# Patient Record
Sex: Female | Born: 1935 | Race: White | Hispanic: No | Smoking: Never smoker
Health system: Southern US, Community
[De-identification: ages and names within clinical notes are randomized; demographics above are authoritative.]

## PROBLEM LIST (undated history)

## (undated) DIAGNOSIS — E785 Hyperlipidemia, unspecified: Secondary | ICD-10-CM

## (undated) DIAGNOSIS — I10 Essential (primary) hypertension: Secondary | ICD-10-CM

## (undated) DIAGNOSIS — I2510 Atherosclerotic heart disease of native coronary artery without angina pectoris: Secondary | ICD-10-CM

## (undated) DIAGNOSIS — I471 Supraventricular tachycardia: Principal | ICD-10-CM

## (undated) DIAGNOSIS — M1990 Unspecified osteoarthritis, unspecified site: Secondary | ICD-10-CM

## (undated) DIAGNOSIS — K611 Rectal abscess: Secondary | ICD-10-CM

## (undated) HISTORY — DX: Atherosclerotic heart disease of native coronary artery without angina pectoris: I25.10

## (undated) HISTORY — PX: CATARACT EXTRACTION: SUR2

## (undated) HISTORY — DX: Supraventricular tachycardia: I47.1

## (undated) HISTORY — DX: Essential (primary) hypertension: I10

## (undated) HISTORY — DX: Hyperlipidemia, unspecified: E78.5

## (undated) HISTORY — DX: Rectal abscess: K61.1

---

## 1976-09-22 HISTORY — PX: ABDOMINAL HYSTERECTOMY: SHX81

## 1999-04-16 ENCOUNTER — Other Ambulatory Visit: Admission: RE | Admit: 1999-04-16 | Discharge: 1999-04-16 | Payer: Self-pay | Admitting: Family Medicine

## 1999-07-25 ENCOUNTER — Encounter: Payer: Self-pay | Admitting: Surgery

## 1999-07-25 ENCOUNTER — Encounter (INDEPENDENT_AMBULATORY_CARE_PROVIDER_SITE_OTHER): Payer: Self-pay | Admitting: Specialist

## 1999-07-25 ENCOUNTER — Ambulatory Visit (HOSPITAL_COMMUNITY): Admission: RE | Admit: 1999-07-25 | Discharge: 1999-07-25 | Payer: Self-pay | Admitting: Surgery

## 1999-07-26 HISTORY — PX: BREAST BIOPSY: SHX20

## 1999-10-02 ENCOUNTER — Ambulatory Visit (HOSPITAL_COMMUNITY): Admission: RE | Admit: 1999-10-02 | Discharge: 1999-10-02 | Payer: Self-pay | Admitting: Gastroenterology

## 2000-09-02 ENCOUNTER — Ambulatory Visit (HOSPITAL_COMMUNITY): Admission: RE | Admit: 2000-09-02 | Discharge: 2000-09-02 | Payer: Self-pay | Admitting: Family Medicine

## 2000-09-02 ENCOUNTER — Encounter: Payer: Self-pay | Admitting: Family Medicine

## 2001-09-09 ENCOUNTER — Encounter: Payer: Self-pay | Admitting: Family Medicine

## 2001-09-09 ENCOUNTER — Ambulatory Visit (HOSPITAL_COMMUNITY): Admission: RE | Admit: 2001-09-09 | Discharge: 2001-09-09 | Payer: Self-pay | Admitting: Family Medicine

## 2001-10-26 ENCOUNTER — Encounter: Admission: RE | Admit: 2001-10-26 | Discharge: 2001-10-26 | Payer: Self-pay | Admitting: Family Medicine

## 2001-10-26 ENCOUNTER — Encounter: Payer: Self-pay | Admitting: Family Medicine

## 2002-08-03 ENCOUNTER — Other Ambulatory Visit: Admission: RE | Admit: 2002-08-03 | Discharge: 2002-08-03 | Payer: Self-pay | Admitting: Family Medicine

## 2002-08-31 ENCOUNTER — Encounter: Payer: Self-pay | Admitting: Family Medicine

## 2002-08-31 ENCOUNTER — Ambulatory Visit (HOSPITAL_COMMUNITY): Admission: RE | Admit: 2002-08-31 | Discharge: 2002-08-31 | Payer: Self-pay | Admitting: Family Medicine

## 2003-09-25 ENCOUNTER — Ambulatory Visit (HOSPITAL_COMMUNITY): Admission: RE | Admit: 2003-09-25 | Discharge: 2003-09-25 | Payer: Self-pay | Admitting: Family Medicine

## 2004-11-25 ENCOUNTER — Ambulatory Visit (HOSPITAL_COMMUNITY): Admission: RE | Admit: 2004-11-25 | Discharge: 2004-11-25 | Payer: Self-pay | Admitting: Family Medicine

## 2005-07-17 ENCOUNTER — Other Ambulatory Visit: Admission: RE | Admit: 2005-07-17 | Discharge: 2005-07-17 | Payer: Self-pay | Admitting: Family Medicine

## 2005-12-05 ENCOUNTER — Ambulatory Visit (HOSPITAL_COMMUNITY): Admission: RE | Admit: 2005-12-05 | Discharge: 2005-12-05 | Payer: Self-pay | Admitting: Family Medicine

## 2006-05-21 ENCOUNTER — Emergency Department (HOSPITAL_COMMUNITY): Admission: EM | Admit: 2006-05-21 | Discharge: 2006-05-21 | Payer: Self-pay | Admitting: Emergency Medicine

## 2006-12-15 ENCOUNTER — Ambulatory Visit (HOSPITAL_COMMUNITY): Admission: RE | Admit: 2006-12-15 | Discharge: 2006-12-15 | Payer: Self-pay | Admitting: Family Medicine

## 2007-12-23 ENCOUNTER — Ambulatory Visit (HOSPITAL_COMMUNITY): Admission: RE | Admit: 2007-12-23 | Discharge: 2007-12-23 | Payer: Self-pay | Admitting: Family Medicine

## 2008-01-03 ENCOUNTER — Encounter: Admission: RE | Admit: 2008-01-03 | Discharge: 2008-01-03 | Payer: Self-pay | Admitting: Family Medicine

## 2008-12-25 ENCOUNTER — Ambulatory Visit (HOSPITAL_COMMUNITY): Admission: RE | Admit: 2008-12-25 | Discharge: 2008-12-25 | Payer: Self-pay | Admitting: Family Medicine

## 2008-12-29 ENCOUNTER — Ambulatory Visit (HOSPITAL_COMMUNITY): Admission: RE | Admit: 2008-12-29 | Discharge: 2008-12-29 | Payer: Self-pay | Admitting: Family Medicine

## 2009-12-27 ENCOUNTER — Ambulatory Visit (HOSPITAL_COMMUNITY): Admission: RE | Admit: 2009-12-27 | Discharge: 2009-12-27 | Payer: Self-pay | Admitting: Family Medicine

## 2010-10-12 ENCOUNTER — Encounter: Payer: Self-pay | Admitting: Family Medicine

## 2010-10-13 ENCOUNTER — Encounter: Payer: Self-pay | Admitting: Family Medicine

## 2010-12-30 ENCOUNTER — Other Ambulatory Visit (HOSPITAL_COMMUNITY): Payer: Self-pay | Admitting: Family Medicine

## 2010-12-30 DIAGNOSIS — Z1231 Encounter for screening mammogram for malignant neoplasm of breast: Secondary | ICD-10-CM

## 2010-12-31 ENCOUNTER — Ambulatory Visit (HOSPITAL_COMMUNITY)
Admission: RE | Admit: 2010-12-31 | Discharge: 2010-12-31 | Disposition: A | Discharge status: Home / Self Care | Payer: Medicare Other | Source: Ambulatory Visit | Attending: Family Medicine | Admitting: Family Medicine

## 2010-12-31 DIAGNOSIS — Z1231 Encounter for screening mammogram for malignant neoplasm of breast: Secondary | ICD-10-CM | POA: Insufficient documentation

## 2011-03-03 ENCOUNTER — Other Ambulatory Visit (HOSPITAL_COMMUNITY): Payer: Self-pay | Admitting: Family Medicine

## 2011-03-03 DIAGNOSIS — Z780 Asymptomatic menopausal state: Secondary | ICD-10-CM

## 2011-03-11 ENCOUNTER — Ambulatory Visit (HOSPITAL_COMMUNITY)
Admission: RE | Admit: 2011-03-11 | Discharge: 2011-03-11 | Disposition: A | Discharge status: Home / Self Care | Payer: Medicare Other | Source: Ambulatory Visit | Attending: Family Medicine | Admitting: Family Medicine

## 2011-03-11 ENCOUNTER — Other Ambulatory Visit (HOSPITAL_COMMUNITY): Payer: Self-pay | Admitting: Family Medicine

## 2011-03-11 DIAGNOSIS — Z13820 Encounter for screening for osteoporosis: Secondary | ICD-10-CM | POA: Insufficient documentation

## 2011-03-11 DIAGNOSIS — Z780 Asymptomatic menopausal state: Secondary | ICD-10-CM

## 2011-05-20 ENCOUNTER — Ambulatory Visit: Payer: Medicare Other | Attending: Family Medicine

## 2011-05-20 DIAGNOSIS — R5381 Other malaise: Secondary | ICD-10-CM | POA: Insufficient documentation

## 2011-05-20 DIAGNOSIS — IMO0001 Reserved for inherently not codable concepts without codable children: Secondary | ICD-10-CM | POA: Insufficient documentation

## 2011-05-20 DIAGNOSIS — M25559 Pain in unspecified hip: Secondary | ICD-10-CM | POA: Insufficient documentation

## 2011-05-23 ENCOUNTER — Ambulatory Visit: Payer: Medicare Other | Admitting: Physical Therapy

## 2011-05-28 ENCOUNTER — Ambulatory Visit: Payer: Medicare Other | Attending: Family Medicine

## 2011-05-28 DIAGNOSIS — IMO0001 Reserved for inherently not codable concepts without codable children: Secondary | ICD-10-CM | POA: Insufficient documentation

## 2011-05-28 DIAGNOSIS — R5381 Other malaise: Secondary | ICD-10-CM | POA: Insufficient documentation

## 2011-05-28 DIAGNOSIS — M25559 Pain in unspecified hip: Secondary | ICD-10-CM | POA: Insufficient documentation

## 2011-05-29 ENCOUNTER — Ambulatory Visit: Payer: Medicare Other

## 2011-06-03 ENCOUNTER — Ambulatory Visit: Payer: Medicare Other | Admitting: Physical Therapy

## 2011-06-05 ENCOUNTER — Encounter: Payer: Medicare Other | Admitting: Physical Therapy

## 2011-09-18 ENCOUNTER — Other Ambulatory Visit: Payer: Self-pay | Admitting: Family Medicine

## 2011-09-18 ENCOUNTER — Ambulatory Visit
Admission: RE | Admit: 2011-09-18 | Discharge: 2011-09-18 | Disposition: A | Discharge status: Home / Self Care | Payer: Medicare Other | Source: Ambulatory Visit | Attending: Family Medicine | Admitting: Family Medicine

## 2011-09-18 DIAGNOSIS — J3489 Other specified disorders of nose and nasal sinuses: Secondary | ICD-10-CM

## 2011-09-18 DIAGNOSIS — R05 Cough: Secondary | ICD-10-CM

## 2011-09-23 DIAGNOSIS — K611 Rectal abscess: Secondary | ICD-10-CM

## 2011-09-23 HISTORY — DX: Rectal abscess: K61.1

## 2011-11-25 DIAGNOSIS — N390 Urinary tract infection, site not specified: Secondary | ICD-10-CM | POA: Diagnosis not present

## 2011-12-22 ENCOUNTER — Other Ambulatory Visit (HOSPITAL_COMMUNITY): Payer: Self-pay | Admitting: Family Medicine

## 2011-12-22 DIAGNOSIS — Z1231 Encounter for screening mammogram for malignant neoplasm of breast: Secondary | ICD-10-CM

## 2012-01-12 DIAGNOSIS — H179 Unspecified corneal scar and opacity: Secondary | ICD-10-CM | POA: Diagnosis not present

## 2012-01-12 DIAGNOSIS — H2510 Age-related nuclear cataract, unspecified eye: Secondary | ICD-10-CM | POA: Diagnosis not present

## 2012-01-12 DIAGNOSIS — IMO0002 Reserved for concepts with insufficient information to code with codable children: Secondary | ICD-10-CM | POA: Insufficient documentation

## 2012-01-14 ENCOUNTER — Ambulatory Visit (HOSPITAL_COMMUNITY)
Admission: RE | Admit: 2012-01-14 | Discharge: 2012-01-14 | Disposition: A | Discharge status: Home / Self Care | Payer: Medicare Other | Source: Ambulatory Visit | Attending: Family Medicine | Admitting: Family Medicine

## 2012-01-14 DIAGNOSIS — Z1231 Encounter for screening mammogram for malignant neoplasm of breast: Secondary | ICD-10-CM | POA: Insufficient documentation

## 2012-01-20 DIAGNOSIS — H903 Sensorineural hearing loss, bilateral: Secondary | ICD-10-CM | POA: Diagnosis not present

## 2012-03-15 DIAGNOSIS — H60399 Other infective otitis externa, unspecified ear: Secondary | ICD-10-CM | POA: Diagnosis not present

## 2012-05-03 DIAGNOSIS — H902 Conductive hearing loss, unspecified: Secondary | ICD-10-CM | POA: Diagnosis not present

## 2012-05-03 DIAGNOSIS — H60399 Other infective otitis externa, unspecified ear: Secondary | ICD-10-CM | POA: Diagnosis not present

## 2012-05-03 DIAGNOSIS — H6120 Impacted cerumen, unspecified ear: Secondary | ICD-10-CM | POA: Diagnosis not present

## 2012-07-12 DIAGNOSIS — Z23 Encounter for immunization: Secondary | ICD-10-CM | POA: Diagnosis not present

## 2012-07-21 DIAGNOSIS — K6289 Other specified diseases of anus and rectum: Secondary | ICD-10-CM | POA: Diagnosis not present

## 2012-07-26 ENCOUNTER — Encounter (INDEPENDENT_AMBULATORY_CARE_PROVIDER_SITE_OTHER): Payer: Self-pay | Admitting: General Surgery

## 2012-07-26 ENCOUNTER — Ambulatory Visit (INDEPENDENT_AMBULATORY_CARE_PROVIDER_SITE_OTHER): Payer: Medicare Other | Admitting: General Surgery

## 2012-07-26 ENCOUNTER — Other Ambulatory Visit (INDEPENDENT_AMBULATORY_CARE_PROVIDER_SITE_OTHER): Payer: Self-pay | Admitting: General Surgery

## 2012-07-26 VITALS — BP 142/90 | HR 71 | Temp 97.1°F | Resp 16 | Ht 67.0 in | Wt 229.8 lb

## 2012-07-26 DIAGNOSIS — K611 Rectal abscess: Secondary | ICD-10-CM

## 2012-07-26 DIAGNOSIS — K610 Anal abscess: Secondary | ICD-10-CM

## 2012-07-26 DIAGNOSIS — K612 Anorectal abscess: Secondary | ICD-10-CM

## 2012-07-26 DIAGNOSIS — K6289 Other specified diseases of anus and rectum: Secondary | ICD-10-CM | POA: Diagnosis not present

## 2012-07-26 NOTE — Assessment & Plan Note (Signed)
Performed incision and drainage on right-sided perianal abscess.  I packed wound with 1/4" Nu Gauze.  Follow up with me in one to 2 weeks. Advised patient to remove packing on Wednesday morning and bathe or shower with dressings out daily.

## 2012-07-26 NOTE — Progress Notes (Signed)
Patient ID: Melissa Lane, female   DOB: [birth year], 78 y.o.   MRN: [id number]  Chief Complaint  Patient presents with  . Rectal Pain    Evaluate possible peri-rectal abscess    HPI Melissa Lane is a 78 y.o. female.    HPI Pt is a 78 year old female with around 2 weeks of right-sided perianal pain. She saw her primary care physician and has been on Augmentin for around a week. This has not gone away and is feeling more tense and more sore.  She saw her PCP again today and had a walnut-sized area of firmness.  She denies fevers/ chills.  She has not had any drainage.  She has never had a perirectal abscess before.   Past Medical History  Diagnosis Date  . Hyperlipidemia   . Hypertension   . Peri-rectal abscess 2013    Past Surgical History  Procedure Date  . Abdominal hysterectomy 1978    Partial    Family History  Problem Relation Age of Onset  . Cancer Mother     Breast Cancer    Social History History  Substance Use Topics  . Smoking status: Never Smoker   . Smokeless tobacco: Not on file  . Alcohol Use: 0.5 - 1.0 oz/week    1-2 drink(s) per week    Allergies  Allergen Reactions  . Macrodantin (Nitrofurantoin Macrocrystal)   . Naproxen   . Vitamin D Analogs     Current Outpatient Prescriptions  Medication Sig Dispense Refill  . atorvastatin (LIPITOR) 40 MG tablet Take 40 mg by mouth daily.      Marland Kitchen losartan-hydrochlorothiazide (HYZAAR) 100-12.5 MG per tablet Take 1 tablet by mouth daily.        Review of Systems Review of Systems  All other systems reviewed and are negative.    Blood pressure 142/90, pulse 71, temperature 97.1 F (36.2 C), temperature source Temporal, resp. rate 16, height 5\' 7"  (1.702 m), weight 229 lb 12.8 oz (104.237 kg).  Physical Exam Physical Exam  Constitutional: She appears well-developed and well-nourished.  HENT:  Head: Normocephalic and atraumatic.  Eyes: Conjunctivae normal are normal. Pupils are equal, round, and  reactive to light.  Cardiovascular: Normal rate and regular rhythm.   Pulmonary/Chest: Effort normal. No respiratory distress.  Abdominal: Soft.  Genitourinary:     Skin: She is not diaphoretic.   No surrounding cellulitis  Assessment    Perianal abscess  Plan    Incision and drainage. Anesthetized with 1% lidocaine with epinephrine. Incised with #11 blade.   Copious purulent drainage removed.   Sent for culture.      Packed with 1/4 " nugauze To return in 3 weeks.    Kimball Appleby 07/26/2012, 5:37 PM

## 2012-07-26 NOTE — Patient Instructions (Signed)
Remove packing in groin Wednesday morning in the bath.  Take shower or bath daily.    Follow up with me in 1-2 weeks.

## 2012-07-30 DIAGNOSIS — E7800 Pure hypercholesterolemia, unspecified: Secondary | ICD-10-CM | POA: Diagnosis not present

## 2012-07-30 DIAGNOSIS — I10 Essential (primary) hypertension: Secondary | ICD-10-CM | POA: Diagnosis not present

## 2012-07-30 LAB — WOUND CULTURE: Gram Stain: NONE SEEN

## 2012-08-04 ENCOUNTER — Telehealth (INDEPENDENT_AMBULATORY_CARE_PROVIDER_SITE_OTHER): Payer: Self-pay

## 2012-08-04 NOTE — Telephone Encounter (Signed)
Pt is not having any pain or drainage from abscess wound.  Dr. Donell Beers does not recommend antibiotics at this time.

## 2012-08-09 ENCOUNTER — Telehealth (INDEPENDENT_AMBULATORY_CARE_PROVIDER_SITE_OTHER): Payer: Self-pay | Admitting: General Surgery

## 2012-08-09 NOTE — Telephone Encounter (Signed)
Melissa Lane called to report that her perianal cyst filled back up on Saturday and started draining bloody/mucous material. She has been using peri-pad to absorb drainage. She changes it twice a day at present. No fever reported. She is asking if she should be placed on antibiotic and or be seen early this week? She also mentioned that she noted mucous in stool./ She is visiting daughter in [phone] Please advise/gy

## 2012-08-09 NOTE — Telephone Encounter (Signed)
Pt called to report that the cyst in perianal area refilled and opened over the weekend, draining bloody/mucous material. She is using a peri-pad to collect drainage, she changes it 1-2 times a day.No fever to report. I reviewed this with Dr. Donell Beers and she ordered Augmentin 875mg  po BID #14, no refills, this was called into [facility], [place], [phone] and to have pt f/u with dr. Donell Beers when pt returns. Pt has an appointment with Dr. Donell Beers this Friday 08-13-12/ pt aware of rx call in/gy

## 2012-08-13 ENCOUNTER — Ambulatory Visit (INDEPENDENT_AMBULATORY_CARE_PROVIDER_SITE_OTHER): Payer: Medicare Other | Admitting: General Surgery

## 2012-08-13 VITALS — BP 160/78 | HR 80 | Resp 20 | Ht 67.0 in | Wt 234.4 lb

## 2012-08-13 DIAGNOSIS — K612 Anorectal abscess: Secondary | ICD-10-CM

## 2012-08-13 DIAGNOSIS — K610 Anal abscess: Secondary | ICD-10-CM

## 2012-08-13 NOTE — Assessment & Plan Note (Signed)
Area was opened and redrained.  There is much less pus this time.  Packing was placed and she was advised to remove this in 48 hours. I will follow her back up in 2 weeks. She is going to finish out her course of Augmentin.

## 2012-08-13 NOTE — Progress Notes (Signed)
HISTORY: Patient is now 2 weeks status post I&D of perirectal abscess. She went to [place] and over the week while she was there, she had increased pain and the area "filled up again" and then spontaneously drained. We called her in a prescription of Augmentin while she was in [place]. She now has decreased pain overall and decreased drainage but is still quite sore.    EXAM: General:  Alert and oriented.   Rectal:  Firm tissue in region of prior abscess.  Tender.  Skin closed, but prior incision bulging, and some purulent drainage was expressed.   Incision:  Prior area on right lateral aspect anesthetized and incised with a #11 blade.  Some purulent drainage was liberated.  This was packed with 1/4 " nugauze.     Microbiology E coli.   ASSESSMENT AND PLAN:   Perianal abscess Area was opened and redrained.  There is much less pus this time.  Packing was placed and she was advised to remove this in 48 hours. I will follow her back up in 2 weeks. She is going to finish out her course of Augmentin.      Maudry Diego, MD Surgical Oncology, General & Endocrine Surgery [facility]  Beverley Fiedler, MD [place], Fanny Dance, MD

## 2012-08-13 NOTE — Patient Instructions (Signed)
Remove packing in 48 hours in a warm bath.  Do daily sitz baths.  Follow up in 2-3 weeks.

## 2012-08-16 ENCOUNTER — Encounter (INDEPENDENT_AMBULATORY_CARE_PROVIDER_SITE_OTHER): Payer: Medicare Other | Admitting: General Surgery

## 2012-08-30 ENCOUNTER — Ambulatory Visit (INDEPENDENT_AMBULATORY_CARE_PROVIDER_SITE_OTHER): Payer: Medicare Other | Admitting: General Surgery

## 2012-08-30 ENCOUNTER — Encounter (INDEPENDENT_AMBULATORY_CARE_PROVIDER_SITE_OTHER): Payer: Self-pay | Admitting: General Surgery

## 2012-08-30 VITALS — BP 144/72 | HR 76 | Temp 97.1°F | Resp 12 | Ht 67.0 in | Wt 231.6 lb

## 2012-08-30 DIAGNOSIS — K610 Anal abscess: Secondary | ICD-10-CM

## 2012-08-30 DIAGNOSIS — K612 Anorectal abscess: Secondary | ICD-10-CM

## 2012-08-30 NOTE — Patient Instructions (Signed)
Follow up with me as needed.

## 2012-08-30 NOTE — Progress Notes (Signed)
HISTORY: Patient is now 2 weeks status post I&D of perirectal abscess. She went to [place] and over the week while she was there, she had increased pain and the area "filled up again" and then spontaneously drained. I reopened the wound again 2 weeks ago.  She is doing better now without pain.  She has not had reacumulation of the drainage.  She did have an episode of some bright red blood on the tissue when she wiped last night that concerned her.     EXAM: General:  Alert and oriented.   Rectal:  Small amount granulation tissue at very surface of prior incision.  No underlying mass/no purulent drainage.   Circumferential non inflamed external hemorrhoids.     ASSESSMENT AND PLAN:   Perianal abscess Nearly completely healed.   Still some granulation tissue present on surface, but no underlying knot like before.  Follow up as needed.  If abscesses recur, she may have fistula and may need fistulotomy and/or seton placement.         Maudry Diego, MD Surgical Oncology, General & Endocrine Surgery [facility]  Beverley Fiedler, MD [place], Fanny Dance, MD

## 2012-08-30 NOTE — Assessment & Plan Note (Signed)
Nearly completely healed.   Still some granulation tissue present on surface, but no underlying knot like before.  Follow up as needed.  If abscesses recur, she may have fistula and may need fistulotomy and/or seton placement.

## 2012-09-08 DIAGNOSIS — H902 Conductive hearing loss, unspecified: Secondary | ICD-10-CM | POA: Diagnosis not present

## 2012-09-08 DIAGNOSIS — H6120 Impacted cerumen, unspecified ear: Secondary | ICD-10-CM | POA: Diagnosis not present

## 2012-09-09 ENCOUNTER — Ambulatory Visit (INDEPENDENT_AMBULATORY_CARE_PROVIDER_SITE_OTHER): Payer: Medicare Other | Admitting: Emergency Medicine

## 2012-09-09 VITALS — BP 128/80 | HR 81 | Temp 98.3°F | Resp 18 | Ht 67.0 in | Wt 232.0 lb

## 2012-09-09 DIAGNOSIS — E782 Mixed hyperlipidemia: Secondary | ICD-10-CM | POA: Insufficient documentation

## 2012-09-09 DIAGNOSIS — J329 Chronic sinusitis, unspecified: Secondary | ICD-10-CM

## 2012-09-09 DIAGNOSIS — E785 Hyperlipidemia, unspecified: Secondary | ICD-10-CM

## 2012-09-09 DIAGNOSIS — I10 Essential (primary) hypertension: Secondary | ICD-10-CM | POA: Insufficient documentation

## 2012-09-09 MED ORDER — AMOXICILLIN-POT CLAVULANATE 875-125 MG PO TABS: 1.0000 | ORAL_TABLET | Freq: Two times a day (BID) | ORAL | Status: DC

## 2012-09-09 NOTE — Progress Notes (Signed)
  Subjective:    Patient ID: Melissa Lane, female    DOB: [birth year], 78 y.o.   MRN: [id number]  HPI presents today with HA, pain in cheeks and teeth,blowing bloody mucous out from nose, cough/productive. Started 2 days ago. Took airborne the past couple days but sxs getting worse. Last sinus infection was several years ago.     Review of Systems     Objective:   Physical Exam HEENT exam patient is alert and cooperative in no distress. Both TMs are clear. Examination of the left nares reveals the mucous membranes to be very dry with some bloody drainage from throat is normal. Chest is clear to both auscultation and percussion.        Assessment & Plan:  Patient here with acute sinusitis. We'll treat with Augmentin and Mucinex and symptomatic treatment.

## 2012-09-09 NOTE — Patient Instructions (Addendum)
Use mucinex twice daily. Use saline nasal nasal spray several times a day.Sinusitis Sinusitis is redness, soreness, and swelling (inflammation) of the paranasal sinuses. Paranasal sinuses are air pockets within the bones of your face (beneath the eyes, the middle of the forehead, or above the eyes). In healthy paranasal sinuses, mucus is able to drain out, and air is able to circulate through them by way of your nose. However, when your paranasal sinuses are inflamed, mucus and air can become trapped. This can allow bacteria and other germs to grow and cause infection. Sinusitis can develop quickly and last only a short time (acute) or continue over a long period (chronic). Sinusitis that lasts for more than 12 weeks is considered chronic.  CAUSES  Causes of sinusitis include:  Allergies.  Structural abnormalities, such as displacement of the cartilage that separates your nostrils (deviated septum), which can decrease the air flow through your nose and sinuses and affect sinus drainage.  Functional abnormalities, such as when the small hairs (cilia) that line your sinuses and help remove mucus do not work properly or are not present. SYMPTOMS  Symptoms of acute and chronic sinusitis are the same. The primary symptoms are pain and pressure around the affected sinuses. Other symptoms include:  Upper toothache.  Earache.  Headache.  Bad breath.  Decreased sense of smell and taste.  A cough, which worsens when you are lying flat.  Fatigue.  Fever.  Thick drainage from your nose, which often is green and may contain pus (purulent).  Swelling and warmth over the affected sinuses. DIAGNOSIS  Your caregiver will perform a physical exam. During the exam, your caregiver may:  Look in your nose for signs of abnormal growths in your nostrils (nasal polyps).  Tap over the affected sinus to check for signs of infection.  View the inside of your sinuses (endoscopy) with a special imaging  device with a light attached (endoscope), which is inserted into your sinuses. If your caregiver suspects that you have chronic sinusitis, one or more of the following tests may be recommended:  Allergy tests.  Nasal culture A sample of mucus is taken from your nose and sent to a lab and screened for bacteria.  Nasal cytology A sample of mucus is taken from your nose and examined by your caregiver to determine if your sinusitis is related to an allergy. TREATMENT  Most cases of acute sinusitis are related to a viral infection and will resolve on their own within 10 days. Sometimes medicines are prescribed to help relieve symptoms (pain medicine, decongestants, nasal steroid sprays, or saline sprays).  However, for sinusitis related to a bacterial infection, your caregiver will prescribe antibiotic medicines. These are medicines that will help kill the bacteria causing the infection.  Rarely, sinusitis is caused by a fungal infection. In theses cases, your caregiver will prescribe antifungal medicine. For some cases of chronic sinusitis, surgery is needed. Generally, these are cases in which sinusitis recurs more than 3 times per year, despite other treatments. HOME CARE INSTRUCTIONS   Drink plenty of water. Water helps thin the mucus so your sinuses can drain more easily.  Use a humidifier.  Inhale steam 3 to 4 times a day (for example, sit in the bathroom with the shower running).  Apply a warm, moist washcloth to your face 3 to 4 times a day, or as directed by your caregiver.  Use saline nasal sprays to help moisten and clean your sinuses.  Take over-the-counter or prescription medicines for pain,  discomfort, or fever only as directed by your caregiver. SEEK IMMEDIATE MEDICAL CARE IF:  You have increasing pain or severe headaches.  You have nausea, vomiting, or drowsiness.  You have swelling around your face.  You have vision problems.  You have a stiff neck.  You have  difficulty breathing. MAKE SURE YOU:   Understand these instructions.  Will watch your condition.  Will get help right away if you are not doing well or get worse. Document Released: 09/08/2005 Document Revised: 12/01/2011 Document Reviewed: 09/23/2011 [facility] Patient Information 2013 [place], [place].

## 2012-09-11 ENCOUNTER — Ambulatory Visit (INDEPENDENT_AMBULATORY_CARE_PROVIDER_SITE_OTHER): Payer: Medicare Other | Admitting: Family Medicine

## 2012-09-11 ENCOUNTER — Ambulatory Visit: Payer: Medicare Other

## 2012-09-11 VITALS — BP 173/84 | HR 88 | Temp 101.1°F | Resp 16 | Ht 65.0 in | Wt 230.0 lb

## 2012-09-11 DIAGNOSIS — R059 Cough, unspecified: Secondary | ICD-10-CM | POA: Diagnosis not present

## 2012-09-11 DIAGNOSIS — R6889 Other general symptoms and signs: Secondary | ICD-10-CM

## 2012-09-11 DIAGNOSIS — J069 Acute upper respiratory infection, unspecified: Secondary | ICD-10-CM | POA: Diagnosis not present

## 2012-09-11 DIAGNOSIS — R509 Fever, unspecified: Secondary | ICD-10-CM

## 2012-09-11 DIAGNOSIS — R05 Cough: Secondary | ICD-10-CM

## 2012-09-11 DIAGNOSIS — R0989 Other specified symptoms and signs involving the circulatory and respiratory systems: Secondary | ICD-10-CM

## 2012-09-11 DIAGNOSIS — J111 Influenza due to unidentified influenza virus with other respiratory manifestations: Secondary | ICD-10-CM

## 2012-09-11 LAB — POCT CBC
Granulocyte percent: 73.3 %G (ref 37–80)
HCT, POC: 46.7 % (ref 37.7–47.9)
Hemoglobin: 14.4 g/dL (ref 12.2–16.2)
Lymph, poc: 2.3 (ref 0.6–3.4)
MCH, POC: 29.9 pg (ref 27–31.2)
MCHC: 30.8 g/dL — AB (ref 31.8–35.4)
MCV: 96.9 fL (ref 80–97)
MID (cbc): 0.5 (ref 0–0.9)
MPV: 10.1 fL (ref 0–99.8)
POC Granulocyte: 7.8 — AB (ref 2–6.9)
POC LYMPH PERCENT: 21.6 %L (ref 10–50)
POC MID %: 5.1 %M (ref 0–12)
Platelet Count, POC: 221 10*3/uL (ref 142–424)
RBC: 4.82 M/uL (ref 4.04–5.48)
RDW, POC: 13.7 %
WBC: 10.6 10*3/uL — AB (ref 4.6–10.2)

## 2012-09-11 LAB — POCT INFLUENZA A/B
Influenza A, POC: NEGATIVE
Influenza B, POC: NEGATIVE

## 2012-09-11 MED ORDER — BENZONATATE 100 MG PO CAPS: 200.0000 mg | ORAL_CAPSULE | Freq: Two times a day (BID) | ORAL | Status: AC | PRN

## 2012-09-11 MED ORDER — LEVOFLOXACIN 500 MG PO TABS: 500.0000 mg | ORAL_TABLET | Freq: Every day | ORAL | Status: DC

## 2012-09-11 MED ORDER — HYDROCODONE-HOMATROPINE 5-1.5 MG/5ML PO SYRP: 5.0000 mL | ORAL_SOLUTION | Freq: Every evening | ORAL | Status: DC | PRN

## 2012-09-11 MED ORDER — IPRATROPIUM BROMIDE 0.02 % IN SOLN: 500.0000 ug | Freq: Four times a day (QID) | RESPIRATORY_TRACT | Status: DC

## 2012-09-11 NOTE — Progress Notes (Signed)
[facility]:  Office Visit  Chief Complaint:  Chief Complaint  Patient presents with  . Cough    productive cough x 2 days  . Nasal Congestion    chest congestion  . Fever    HPI: Melissa Lane is a 78 y.o. female who complains of  Acute sinusitis, was here 2 days ago and has had no improvement , actually  Coughing is  worse on mucinex and augmention. She has now msk aches, SOB, wheeze, worsening Left sided sinus pressure. + fevers, chills  Past Medical History  Diagnosis Date  . Hyperlipidemia   . Hypertension   . Peri-rectal abscess 2013   Past Surgical History  Procedure Date  . Abdominal hysterectomy 1978    Partial   History   Social History  . Marital Status: Married    Spouse Name: N/A    Number of Children: N/A  . Years of Education: N/A   Social History Main Topics  . Smoking status: Never Smoker   . Smokeless tobacco: Never Used  . Alcohol Use: 0.5 - 1.0 oz/week    1-2 drink(s) per week  . Drug Use: No  . Sexually Active: None   Other Topics Concern  . None   Social History Narrative  . None   Family History  Problem Relation Age of Onset  . Cancer Mother     Breast Cancer   Allergies  Allergen Reactions  . Macrodantin (Nitrofurantoin Macrocrystal)   . Naproxen   . Vitamin D Analogs    Prior to Admission medications   Medication Sig Start Date End Date Taking? Authorizing Provider  amoxicillin-clavulanate (AUGMENTIN) 875-125 MG per tablet Take 1 tablet by mouth 2 (two) times daily. 09/09/12  Yes Collene Gobble, MD  atorvastatin (LIPITOR) 40 MG tablet Take 40 mg by mouth daily.   Yes Historical Provider, MD  losartan-hydrochlorothiazide (HYZAAR) 100-12.5 MG per tablet Take 1 tablet by mouth daily.   Yes Historical Provider, MD     ROS: The patient denies  night sweats, unintentional weight loss, chest pain, palpitations, wheezing, dyspnea on exertion, nausea, vomiting, abdominal pain, dysuria, hematuria, melena, numbness,  weakness, or tingling.   All other systems have been reviewed and were otherwise negative with the exception of those mentioned in the HPI and as above.    PHYSICAL EXAM: Filed Vitals:   09/11/12 1748  BP: 173/84  Pulse: 88  Temp: 101.1 F (38.4 C)  Resp: 16   Filed Vitals:   09/11/12 1748  Height: 5\' 5"  (1.651 m)  Weight: 230 lb (104.327 kg)   Body mass index is 38.27 kg/(m^2).  General: Alert, no acute distress HEENT:  Normocephalic, atraumatic, oropharynx patent. + sinus tenderness, no exudates, TM nl Cardiovascular:  Regular rate and rhythm, no rubs murmurs or gallops.  No Carotid bruits, radial pulse intact. No pedal edema.  Respiratory: Clear to auscultation bilaterally.  No wheezes, rales, or rhonchi.  No cyanosis, no use of accessory musculature GI: No organomegaly, abdomen is soft and non-tender, positive bowel sounds.  No masses. Skin: No rashes. Neurologic: Facial musculature symmetric. Psychiatric: Patient is appropriate throughout our interaction. Lymphatic: No cervical lymphadenopathy Musculoskeletal: Gait intact.   LABS: Results for orders placed in visit on 09/11/12  POCT INFLUENZA A/B      Component Value Range   Influenza A, POC Negative     Influenza B, POC Negative    POCT CBC      Component Value Range  WBC 10.6 (*) 4.6 - 10.2 K/uL   Lymph, poc 2.3  0.6 - 3.4   POC LYMPH PERCENT 21.6  10 - 50 %L   MID (cbc) 0.5  0 - 0.9   POC MID % 5.1  0 - 12 %M   POC Granulocyte 7.8 (*) 2 - 6.9   Granulocyte percent 73.3  37 - 80 %G   RBC 4.82  4.04 - 5.48 M/uL   Hemoglobin 14.4  12.2 - 16.2 g/dL   HCT, POC 45.4  09.8 - 47.9 %   MCV 96.9  80 - 97 fL   MCH, POC 29.9  27 - 31.2 pg   MCHC 30.8 (*) 31.8 - 35.4 g/dL   RDW, POC 11.9     Platelet Count, POC 221  142 - 424 K/uL   MPV 10.1  0 - 99.8 fL     EKG/XRAY:   Primary read interpreted by Dr. Conley Rolls at [facility]. No pneumothorax ? Right lower lobe fluffiness/opacity increase vascular markings vs  infiltrate   ASSESSMENT/PLAN: Encounter Diagnoses  Name Primary?  . Flu-like symptoms Yes  . Chest congestion   . Cough   . Fever    Acute sinusitis with worsening sxs, xrays ? Infiltrates RLL vs increase vascular markings Will change abx, DC Augmentin Start Levaquin, Atrovent NS and Tessalon Perles and Hydromet    Melissa Shepherd PHUONG, DO 09/11/2012 7:31 PM

## 2013-01-19 ENCOUNTER — Other Ambulatory Visit (HOSPITAL_COMMUNITY): Payer: Self-pay | Admitting: Family Medicine

## 2013-01-19 DIAGNOSIS — Z1231 Encounter for screening mammogram for malignant neoplasm of breast: Secondary | ICD-10-CM

## 2013-01-25 ENCOUNTER — Ambulatory Visit (HOSPITAL_COMMUNITY)
Admission: RE | Admit: 2013-01-25 | Discharge: 2013-01-25 | Disposition: A | Discharge status: Home / Self Care | Payer: Medicare Other | Source: Ambulatory Visit | Attending: Family Medicine | Admitting: Family Medicine

## 2013-01-25 DIAGNOSIS — Z1231 Encounter for screening mammogram for malignant neoplasm of breast: Secondary | ICD-10-CM | POA: Insufficient documentation

## 2013-03-16 DIAGNOSIS — R350 Frequency of micturition: Secondary | ICD-10-CM | POA: Diagnosis not present

## 2013-03-16 DIAGNOSIS — R300 Dysuria: Secondary | ICD-10-CM | POA: Diagnosis not present

## 2013-03-16 DIAGNOSIS — N390 Urinary tract infection, site not specified: Secondary | ICD-10-CM | POA: Diagnosis not present

## 2013-04-21 DIAGNOSIS — K603 Anal fistula: Secondary | ICD-10-CM | POA: Diagnosis not present

## 2013-04-21 DIAGNOSIS — K612 Anorectal abscess: Secondary | ICD-10-CM | POA: Diagnosis not present

## 2013-05-09 DIAGNOSIS — K603 Anal fistula: Secondary | ICD-10-CM | POA: Diagnosis not present

## 2013-05-09 DIAGNOSIS — E785 Hyperlipidemia, unspecified: Secondary | ICD-10-CM | POA: Diagnosis not present

## 2013-05-09 DIAGNOSIS — I10 Essential (primary) hypertension: Secondary | ICD-10-CM | POA: Diagnosis not present

## 2013-05-09 DIAGNOSIS — Z79899 Other long term (current) drug therapy: Secondary | ICD-10-CM | POA: Diagnosis not present

## 2013-05-09 DIAGNOSIS — H903 Sensorineural hearing loss, bilateral: Secondary | ICD-10-CM | POA: Diagnosis not present

## 2013-05-11 DIAGNOSIS — L909 Atrophic disorder of skin, unspecified: Secondary | ICD-10-CM | POA: Diagnosis not present

## 2013-05-11 DIAGNOSIS — I10 Essential (primary) hypertension: Secondary | ICD-10-CM | POA: Diagnosis not present

## 2013-05-11 DIAGNOSIS — Z79899 Other long term (current) drug therapy: Secondary | ICD-10-CM | POA: Diagnosis not present

## 2013-05-11 DIAGNOSIS — E785 Hyperlipidemia, unspecified: Secondary | ICD-10-CM | POA: Diagnosis not present

## 2013-05-11 DIAGNOSIS — K603 Anal fistula: Secondary | ICD-10-CM | POA: Diagnosis not present

## 2013-05-11 DIAGNOSIS — K604 Rectal fistula: Secondary | ICD-10-CM | POA: Insufficient documentation

## 2013-05-12 DIAGNOSIS — E785 Hyperlipidemia, unspecified: Secondary | ICD-10-CM | POA: Diagnosis not present

## 2013-05-12 DIAGNOSIS — Z79899 Other long term (current) drug therapy: Secondary | ICD-10-CM | POA: Diagnosis not present

## 2013-05-12 DIAGNOSIS — K603 Anal fistula: Secondary | ICD-10-CM | POA: Diagnosis not present

## 2013-05-12 DIAGNOSIS — I10 Essential (primary) hypertension: Secondary | ICD-10-CM | POA: Diagnosis not present

## 2013-06-03 DIAGNOSIS — Z48815 Encounter for surgical aftercare following surgery on the digestive system: Secondary | ICD-10-CM | POA: Diagnosis not present

## 2013-06-03 DIAGNOSIS — K603 Anal fistula: Secondary | ICD-10-CM | POA: Diagnosis not present

## 2013-07-26 DIAGNOSIS — Z23 Encounter for immunization: Secondary | ICD-10-CM | POA: Diagnosis not present

## 2013-07-26 DIAGNOSIS — E782 Mixed hyperlipidemia: Secondary | ICD-10-CM | POA: Diagnosis not present

## 2013-07-26 DIAGNOSIS — I10 Essential (primary) hypertension: Secondary | ICD-10-CM | POA: Diagnosis not present

## 2014-01-02 ENCOUNTER — Other Ambulatory Visit (HOSPITAL_COMMUNITY): Payer: Self-pay | Admitting: Family Medicine

## 2014-01-02 DIAGNOSIS — Z1231 Encounter for screening mammogram for malignant neoplasm of breast: Secondary | ICD-10-CM

## 2014-01-17 DIAGNOSIS — Z23 Encounter for immunization: Secondary | ICD-10-CM | POA: Diagnosis not present

## 2014-01-30 DIAGNOSIS — H6120 Impacted cerumen, unspecified ear: Secondary | ICD-10-CM | POA: Diagnosis not present

## 2014-01-30 DIAGNOSIS — H60399 Other infective otitis externa, unspecified ear: Secondary | ICD-10-CM | POA: Diagnosis not present

## 2014-01-31 ENCOUNTER — Ambulatory Visit (HOSPITAL_COMMUNITY)
Admission: RE | Admit: 2014-01-31 | Discharge: 2014-01-31 | Disposition: A | Discharge status: Home / Self Care | Payer: Medicare Other | Source: Ambulatory Visit | Attending: Family Medicine | Admitting: Family Medicine

## 2014-01-31 DIAGNOSIS — Z1231 Encounter for screening mammogram for malignant neoplasm of breast: Secondary | ICD-10-CM | POA: Diagnosis not present

## 2014-02-17 DIAGNOSIS — H179 Unspecified corneal scar and opacity: Secondary | ICD-10-CM | POA: Diagnosis not present

## 2014-02-17 DIAGNOSIS — H2510 Age-related nuclear cataract, unspecified eye: Secondary | ICD-10-CM | POA: Diagnosis not present

## 2014-03-02 DIAGNOSIS — N390 Urinary tract infection, site not specified: Secondary | ICD-10-CM | POA: Diagnosis not present

## 2014-04-28 DIAGNOSIS — L819 Disorder of pigmentation, unspecified: Secondary | ICD-10-CM | POA: Diagnosis not present

## 2014-04-28 DIAGNOSIS — D239 Other benign neoplasm of skin, unspecified: Secondary | ICD-10-CM | POA: Diagnosis not present

## 2014-04-28 DIAGNOSIS — L908 Other atrophic disorders of skin: Secondary | ICD-10-CM | POA: Diagnosis not present

## 2014-04-28 DIAGNOSIS — L918 Other hypertrophic disorders of the skin: Secondary | ICD-10-CM | POA: Diagnosis not present

## 2014-04-28 DIAGNOSIS — D1801 Hemangioma of skin and subcutaneous tissue: Secondary | ICD-10-CM | POA: Diagnosis not present

## 2014-04-28 DIAGNOSIS — L821 Other seborrheic keratosis: Secondary | ICD-10-CM | POA: Diagnosis not present

## 2014-05-15 DIAGNOSIS — E782 Mixed hyperlipidemia: Secondary | ICD-10-CM | POA: Diagnosis not present

## 2014-05-15 DIAGNOSIS — I10 Essential (primary) hypertension: Secondary | ICD-10-CM | POA: Diagnosis not present

## 2014-06-29 DIAGNOSIS — R7309 Other abnormal glucose: Secondary | ICD-10-CM | POA: Diagnosis not present

## 2014-08-08 DIAGNOSIS — Z23 Encounter for immunization: Secondary | ICD-10-CM | POA: Diagnosis not present

## 2014-09-22 DIAGNOSIS — I2510 Atherosclerotic heart disease of native coronary artery without angina pectoris: Secondary | ICD-10-CM

## 2014-09-22 HISTORY — DX: Atherosclerotic heart disease of native coronary artery without angina pectoris: I25.10

## 2014-11-21 DIAGNOSIS — N390 Urinary tract infection, site not specified: Secondary | ICD-10-CM | POA: Diagnosis not present

## 2014-12-18 DIAGNOSIS — N898 Other specified noninflammatory disorders of vagina: Secondary | ICD-10-CM | POA: Diagnosis not present

## 2014-12-18 DIAGNOSIS — N390 Urinary tract infection, site not specified: Secondary | ICD-10-CM | POA: Diagnosis not present

## 2015-02-16 DIAGNOSIS — N3001 Acute cystitis with hematuria: Secondary | ICD-10-CM | POA: Diagnosis not present

## 2015-02-16 DIAGNOSIS — R399 Unspecified symptoms and signs involving the genitourinary system: Secondary | ICD-10-CM | POA: Diagnosis not present

## 2015-02-23 DIAGNOSIS — H9190 Unspecified hearing loss, unspecified ear: Secondary | ICD-10-CM | POA: Diagnosis not present

## 2015-03-07 ENCOUNTER — Other Ambulatory Visit (HOSPITAL_COMMUNITY): Payer: Self-pay | Admitting: Family Medicine

## 2015-03-07 DIAGNOSIS — Z1231 Encounter for screening mammogram for malignant neoplasm of breast: Secondary | ICD-10-CM

## 2015-03-20 ENCOUNTER — Ambulatory Visit (HOSPITAL_COMMUNITY)
Admission: RE | Admit: 2015-03-20 | Discharge: 2015-03-20 | Disposition: A | Discharge status: Home / Self Care | Payer: Medicare Other | Source: Ambulatory Visit | Attending: Family Medicine | Admitting: Family Medicine

## 2015-03-20 ENCOUNTER — Other Ambulatory Visit (HOSPITAL_COMMUNITY): Payer: Self-pay | Admitting: Family Medicine

## 2015-03-20 DIAGNOSIS — Z1231 Encounter for screening mammogram for malignant neoplasm of breast: Secondary | ICD-10-CM | POA: Diagnosis present

## 2015-03-21 DIAGNOSIS — H179 Unspecified corneal scar and opacity: Secondary | ICD-10-CM | POA: Diagnosis not present

## 2015-03-21 DIAGNOSIS — H2513 Age-related nuclear cataract, bilateral: Secondary | ICD-10-CM | POA: Diagnosis not present

## 2015-04-10 DIAGNOSIS — E782 Mixed hyperlipidemia: Secondary | ICD-10-CM | POA: Diagnosis not present

## 2015-04-10 DIAGNOSIS — M8589 Other specified disorders of bone density and structure, multiple sites: Secondary | ICD-10-CM | POA: Diagnosis not present

## 2015-04-10 DIAGNOSIS — M25519 Pain in unspecified shoulder: Secondary | ICD-10-CM | POA: Diagnosis not present

## 2015-04-10 DIAGNOSIS — I10 Essential (primary) hypertension: Secondary | ICD-10-CM | POA: Diagnosis not present

## 2015-04-10 DIAGNOSIS — Z23 Encounter for immunization: Secondary | ICD-10-CM | POA: Diagnosis not present

## 2015-04-19 DIAGNOSIS — L821 Other seborrheic keratosis: Secondary | ICD-10-CM | POA: Diagnosis not present

## 2015-04-19 DIAGNOSIS — L218 Other seborrheic dermatitis: Secondary | ICD-10-CM | POA: Diagnosis not present

## 2015-05-21 DIAGNOSIS — H2513 Age-related nuclear cataract, bilateral: Secondary | ICD-10-CM | POA: Diagnosis not present

## 2015-05-21 DIAGNOSIS — Z79899 Other long term (current) drug therapy: Secondary | ICD-10-CM | POA: Diagnosis not present

## 2015-05-21 DIAGNOSIS — Z8249 Family history of ischemic heart disease and other diseases of the circulatory system: Secondary | ICD-10-CM | POA: Diagnosis not present

## 2015-05-21 DIAGNOSIS — Z818 Family history of other mental and behavioral disorders: Secondary | ICD-10-CM | POA: Diagnosis not present

## 2015-05-21 DIAGNOSIS — Z888 Allergy status to other drugs, medicaments and biological substances status: Secondary | ICD-10-CM | POA: Diagnosis not present

## 2015-05-21 DIAGNOSIS — Z809 Family history of malignant neoplasm, unspecified: Secondary | ICD-10-CM | POA: Diagnosis not present

## 2015-05-21 DIAGNOSIS — K0521 Aggressive periodontitis, localized: Secondary | ICD-10-CM | POA: Diagnosis not present

## 2015-05-31 DIAGNOSIS — I10 Essential (primary) hypertension: Secondary | ICD-10-CM | POA: Diagnosis not present

## 2015-05-31 DIAGNOSIS — E785 Hyperlipidemia, unspecified: Secondary | ICD-10-CM | POA: Diagnosis not present

## 2015-05-31 DIAGNOSIS — H2513 Age-related nuclear cataract, bilateral: Secondary | ICD-10-CM | POA: Diagnosis not present

## 2015-05-31 DIAGNOSIS — Z79899 Other long term (current) drug therapy: Secondary | ICD-10-CM | POA: Diagnosis not present

## 2015-05-31 DIAGNOSIS — H25812 Combined forms of age-related cataract, left eye: Secondary | ICD-10-CM | POA: Diagnosis not present

## 2015-06-01 DIAGNOSIS — Z961 Presence of intraocular lens: Secondary | ICD-10-CM | POA: Insufficient documentation

## 2015-06-04 DIAGNOSIS — Z780 Asymptomatic menopausal state: Secondary | ICD-10-CM | POA: Diagnosis not present

## 2015-06-04 DIAGNOSIS — M8589 Other specified disorders of bone density and structure, multiple sites: Secondary | ICD-10-CM | POA: Diagnosis not present

## 2015-06-08 DIAGNOSIS — H269 Unspecified cataract: Secondary | ICD-10-CM | POA: Diagnosis not present

## 2015-06-08 DIAGNOSIS — Z48810 Encounter for surgical aftercare following surgery on the sense organs: Secondary | ICD-10-CM | POA: Diagnosis not present

## 2015-06-08 DIAGNOSIS — H179 Unspecified corneal scar and opacity: Secondary | ICD-10-CM | POA: Diagnosis not present

## 2015-06-08 DIAGNOSIS — I10 Essential (primary) hypertension: Secondary | ICD-10-CM | POA: Diagnosis not present

## 2015-06-08 DIAGNOSIS — Z961 Presence of intraocular lens: Secondary | ICD-10-CM | POA: Diagnosis not present

## 2015-06-14 DIAGNOSIS — H2511 Age-related nuclear cataract, right eye: Secondary | ICD-10-CM | POA: Diagnosis not present

## 2015-06-14 DIAGNOSIS — I10 Essential (primary) hypertension: Secondary | ICD-10-CM | POA: Diagnosis not present

## 2015-06-14 DIAGNOSIS — H25811 Combined forms of age-related cataract, right eye: Secondary | ICD-10-CM | POA: Diagnosis not present

## 2015-06-14 DIAGNOSIS — E785 Hyperlipidemia, unspecified: Secondary | ICD-10-CM | POA: Diagnosis not present

## 2015-06-14 DIAGNOSIS — Z79899 Other long term (current) drug therapy: Secondary | ICD-10-CM | POA: Diagnosis not present

## 2015-06-20 DIAGNOSIS — H43813 Vitreous degeneration, bilateral: Secondary | ICD-10-CM | POA: Diagnosis not present

## 2015-06-20 DIAGNOSIS — Z961 Presence of intraocular lens: Secondary | ICD-10-CM | POA: Diagnosis not present

## 2015-09-06 DIAGNOSIS — Z23 Encounter for immunization: Secondary | ICD-10-CM | POA: Diagnosis not present

## 2015-09-07 DIAGNOSIS — H26491 Other secondary cataract, right eye: Secondary | ICD-10-CM | POA: Diagnosis not present

## 2015-09-25 DIAGNOSIS — R7301 Impaired fasting glucose: Secondary | ICD-10-CM | POA: Diagnosis not present

## 2015-09-25 DIAGNOSIS — Z0000 Encounter for general adult medical examination without abnormal findings: Secondary | ICD-10-CM | POA: Diagnosis not present

## 2015-09-25 DIAGNOSIS — E782 Mixed hyperlipidemia: Secondary | ICD-10-CM | POA: Diagnosis not present

## 2015-09-25 DIAGNOSIS — Z131 Encounter for screening for diabetes mellitus: Secondary | ICD-10-CM | POA: Diagnosis not present

## 2015-09-25 DIAGNOSIS — I10 Essential (primary) hypertension: Secondary | ICD-10-CM | POA: Diagnosis not present

## 2015-09-26 DIAGNOSIS — H26492 Other secondary cataract, left eye: Secondary | ICD-10-CM | POA: Diagnosis not present

## 2016-01-07 DIAGNOSIS — N390 Urinary tract infection, site not specified: Secondary | ICD-10-CM | POA: Diagnosis not present

## 2016-01-07 DIAGNOSIS — R300 Dysuria: Secondary | ICD-10-CM | POA: Diagnosis not present

## 2016-01-10 DIAGNOSIS — K5792 Diverticulitis of intestine, part unspecified, without perforation or abscess without bleeding: Secondary | ICD-10-CM | POA: Diagnosis not present

## 2016-01-10 DIAGNOSIS — R109 Unspecified abdominal pain: Secondary | ICD-10-CM | POA: Diagnosis not present

## 2016-01-10 DIAGNOSIS — R350 Frequency of micturition: Secondary | ICD-10-CM | POA: Diagnosis not present

## 2016-01-14 DIAGNOSIS — K5792 Diverticulitis of intestine, part unspecified, without perforation or abscess without bleeding: Secondary | ICD-10-CM | POA: Diagnosis not present

## 2016-01-14 DIAGNOSIS — R109 Unspecified abdominal pain: Secondary | ICD-10-CM | POA: Diagnosis not present

## 2016-01-15 DIAGNOSIS — Z90710 Acquired absence of both cervix and uterus: Secondary | ICD-10-CM | POA: Diagnosis not present

## 2016-01-15 DIAGNOSIS — Z87440 Personal history of urinary (tract) infections: Secondary | ICD-10-CM | POA: Diagnosis not present

## 2016-01-15 DIAGNOSIS — R6883 Chills (without fever): Secondary | ICD-10-CM | POA: Diagnosis not present

## 2016-01-15 DIAGNOSIS — K760 Fatty (change of) liver, not elsewhere classified: Secondary | ICD-10-CM | POA: Diagnosis not present

## 2016-01-15 DIAGNOSIS — K449 Diaphragmatic hernia without obstruction or gangrene: Secondary | ICD-10-CM | POA: Diagnosis not present

## 2016-01-15 DIAGNOSIS — R197 Diarrhea, unspecified: Secondary | ICD-10-CM | POA: Diagnosis not present

## 2016-01-15 DIAGNOSIS — N281 Cyst of kidney, acquired: Secondary | ICD-10-CM | POA: Diagnosis not present

## 2016-01-15 DIAGNOSIS — K7689 Other specified diseases of liver: Secondary | ICD-10-CM | POA: Diagnosis not present

## 2016-01-15 DIAGNOSIS — K5730 Diverticulosis of large intestine without perforation or abscess without bleeding: Secondary | ICD-10-CM | POA: Diagnosis not present

## 2016-01-30 ENCOUNTER — Telehealth: Payer: Self-pay | Admitting: Cardiology

## 2016-01-30 DIAGNOSIS — H179 Unspecified corneal scar and opacity: Secondary | ICD-10-CM | POA: Diagnosis not present

## 2016-01-30 DIAGNOSIS — Z961 Presence of intraocular lens: Secondary | ICD-10-CM | POA: Diagnosis not present

## 2016-01-30 DIAGNOSIS — H04129 Dry eye syndrome of unspecified lacrimal gland: Secondary | ICD-10-CM | POA: Diagnosis not present

## 2016-01-30 NOTE — Telephone Encounter (Signed)
Received records from [place] for appointment on 02/21/16 with Dr Percival Spanish.  Records given to [facility] (medical records) for Dr Hochrein's schedule on 02/21/16. lp

## 2016-02-20 NOTE — Progress Notes (Signed)
Cardiology Office Note   Date:  02/21/2016   ID:  Melissa Lane, DOB [birth year], MRN [id number]  PCP:  Aretta Nip, MD  Cardiologist:   Minus Breeding, MD   Chief Complaint  Patient presents with  . Coronary calcium     History of Present Illness: Melissa Lane is a 82 y.o. female who presents for evaluation of coronary calcium. This was noted recently when she had a CT scan. This was done to evaluate abdominal pain.  The report suggested possible cardiomegaly. There was noted to be atherosclerosis in the aorta and iliacs. There was mention of coronary calcium. There was also atelectasis on the x-ray, hepatic and renal cysts as well as suggestion of a fatty liver. All of this was very concerning to the patient. She actually has had no past cardiac history. She has not had any other cardiac testing by her. She doesn't exercise routinely. She does do some activity such as climbing stairs making a bed which might make her short of breath. This has been chronic. She does not describe chest pressure, neck or arm discomfort. She does not describe palpitations, presyncope or syncope. She does not have PND or orthopnea. She's had no weight gain or edema.  Past Medical History  Diagnosis Date  . Hyperlipidemia   . Hypertension   . Peri-rectal abscess 2013    Past Surgical History  Procedure Laterality Date  . Abdominal hysterectomy  1978    Partial  . Cataract extraction       Current Outpatient Prescriptions  Medication Sig Dispense Refill  . atorvastatin (LIPITOR) 40 MG tablet Take 40 mg by mouth daily.    Marland Kitchen losartan-hydrochlorothiazide (HYZAAR) 100-12.5 MG per tablet Take 1 tablet by mouth daily.     No current facility-administered medications for this visit.    Allergies:   Macrodantin; Naproxen; and Vitamin d analogs    Social History:  The patient  reports that she has never smoked. She has never used smokeless tobacco. She reports that she drinks about 0.5 - 1.0 oz  of alcohol per week. She reports that she does not use illicit drugs.   Family History:  The patient's family history includes Alzheimer's disease in her mother; Cancer in her mother; Crohn's disease in her son; Heart attack (age of onset: 86) in her father.    ROS:  Please see the history of present illness.   Otherwise, review of systems are positive for none.   All other systems are reviewed and negative.    PHYSICAL EXAM: VS:  BP 151/101 mmHg  Pulse 79  Ht 5\' 6"  (1.676 m)  Wt 220 lb 3.2 oz (99.882 kg)  BMI 35.56 kg/m2 , BMI Body mass index is 35.56 kg/(m^2). GENERAL:  Well appearing HEENT:  Pupils equal round and reactive, fundi not visualized, oral mucosa unremarkable NECK:  No jugular venous distention, waveform within normal limits, carotid upstroke brisk and symmetric, right soft bruit, no thyromegaly LYMPHATICS:  No cervical, inguinal adenopathy LUNGS:  Clear to auscultation bilaterally BACK:  No CVA tenderness CHEST:  Unremarkable HEART:  PMI not displaced or sustained,S1 and S2 within normal limits, no S3, no S4, no clicks, no rubs, no murmurs ABD:  Flat, positive bowel sounds normal in frequency in pitch, no bruits, no rebound, no guarding, no midline pulsatile mass, no hepatomegaly, no splenomegaly EXT:  2 plus pulses throughout, no edema, no cyanosis no clubbing SKIN:  No rashes no nodules NEURO:  Cranial nerves II  through XII grossly intact, motor grossly intact throughout [facility]:  Cognitively intact, oriented to person place and time    EKG:  EKG is ordered today. The ekg ordered today demonstrates sinus rhythm, rate 68, axis within normal limits, intervals within normal limits, no acute ST-T wave changes.   Recent Labs: No results found for requested labs within last 365 days.    Lipid Panel No results found for: CHOL, TRIG, HDL, CHOLHDL, VLDL, LDLCALC, LDLDIRECT    Wt Readings from Last 3 Encounters:  02/21/16 220 lb 3.2 oz (99.882 kg)  09/11/12 230 lb  (104.327 kg)  09/09/12 232 lb (105.235 kg)      Other studies Reviewed: Additional studies/ records that were reviewed today include: Office records and CT report. Review of the above records demonstrates:  Please see elsewhere in the note.     ASSESSMENT AND PLAN:  CORONARY CALCIUM:   She does have significant risk factors.  She has some difficulty with breathing.  She would not be able to walk on a treadmill.  She will have a [employer].  CARDIOMEGALY:  There was a question of this on CT.  However, I don't suspect this clinically.  I will order a BNP to screen.    DYSLIPIDEMIA:  Her lipids are excellent.  She will continue the meds as listed.    HTN:   Her BP is elevated but this is unusual.  She will keep a BP diary and we might need to adjust meds.    BRUIT:  She will have carotid Doppler  WEIGHT:  She and I talked at length about exercise and diet.    Current medicines are reviewed at length with the patient today.  The patient does not have concerns regarding medicines.  The following changes have been made:  no change  Labs/ tests ordered today include:   Orders Placed This Encounter  Procedures  . B Nat Peptide  . Myocardial Perfusion Imaging  . EKG 12-Lead     Disposition:   FU with me in one year or sooner if needed.      Signed, Minus Breeding, MD  02/21/2016 1:11 PM    [facility]

## 2016-02-21 ENCOUNTER — Ambulatory Visit (INDEPENDENT_AMBULATORY_CARE_PROVIDER_SITE_OTHER): Payer: Medicare Other | Admitting: Cardiology

## 2016-02-21 ENCOUNTER — Encounter: Payer: Self-pay | Admitting: Cardiology

## 2016-02-21 VITALS — BP 151/101 | HR 79 | Ht 66.0 in | Wt 220.2 lb

## 2016-02-21 DIAGNOSIS — I2510 Atherosclerotic heart disease of native coronary artery without angina pectoris: Secondary | ICD-10-CM | POA: Diagnosis not present

## 2016-02-21 DIAGNOSIS — R0602 Shortness of breath: Secondary | ICD-10-CM

## 2016-02-21 DIAGNOSIS — I2584 Coronary atherosclerosis due to calcified coronary lesion: Secondary | ICD-10-CM

## 2016-02-21 DIAGNOSIS — R0989 Other specified symptoms and signs involving the circulatory and respiratory systems: Secondary | ICD-10-CM | POA: Insufficient documentation

## 2016-02-21 DIAGNOSIS — R931 Abnormal findings on diagnostic imaging of heart and coronary circulation: Secondary | ICD-10-CM

## 2016-02-21 NOTE — Patient Instructions (Signed)
Medication Instructions:  Continue current medication  Labwork: BNP  Testing/Procedures: Your physician has requested that you have a lexiscan myoview. For further information please visit [email or web address]. Please follow instruction sheet, as given.  Your physician has requested that you have a carotid duplex. This test is an ultrasound of the carotid arteries in your neck. It looks at blood flow through these arteries that supply the brain with blood. Allow one hour for this exam. There are no restrictions or special instructions.  Follow-Up: Your physician wants you to follow-up in: 1 Year. You will receive a reminder letter in the mail two months in advance. If you don't receive a letter, please call our office to schedule the follow-up appointment.   Any Other Special Instructions Will Be Listed Below (If Applicable).   If you need a refill on your cardiac medications before your next appointment, please call your pharmacy.

## 2016-02-22 LAB — BRAIN NATRIURETIC PEPTIDE: BRAIN NATRIURETIC PEPTIDE: 98.1 pg/mL (ref <100)

## 2016-02-28 ENCOUNTER — Other Ambulatory Visit: Payer: Self-pay | Admitting: Physician Assistant

## 2016-02-28 DIAGNOSIS — K769 Liver disease, unspecified: Secondary | ICD-10-CM

## 2016-03-07 ENCOUNTER — Telehealth (HOSPITAL_COMMUNITY): Payer: Self-pay

## 2016-03-07 NOTE — Telephone Encounter (Signed)
Encounter complete. 

## 2016-03-08 ENCOUNTER — Other Ambulatory Visit: Payer: Self-pay

## 2016-03-09 ENCOUNTER — Ambulatory Visit
Admission: RE | Admit: 2016-03-09 | Discharge: 2016-03-09 | Disposition: A | Discharge status: Home / Self Care | Payer: Medicare Other | Source: Ambulatory Visit | Attending: Physician Assistant | Admitting: Physician Assistant

## 2016-03-09 DIAGNOSIS — K7689 Other specified diseases of liver: Secondary | ICD-10-CM | POA: Diagnosis not present

## 2016-03-09 DIAGNOSIS — K769 Liver disease, unspecified: Secondary | ICD-10-CM

## 2016-03-09 MED ORDER — GADOBENATE DIMEGLUMINE 529 MG/ML IV SOLN
20.0000 mL | Freq: Once | INTRAVENOUS | Status: AC | PRN
  Administered 2016-03-09: 20 mL via INTRAVENOUS

## 2016-03-11 ENCOUNTER — Other Ambulatory Visit: Payer: Self-pay

## 2016-03-12 ENCOUNTER — Ambulatory Visit (HOSPITAL_COMMUNITY)
Admission: RE | Admit: 2016-03-12 | Discharge: 2016-03-12 | Disposition: A | Discharge status: Home / Self Care | Payer: Medicare Other | Source: Ambulatory Visit | Attending: Cardiovascular Disease | Admitting: Cardiovascular Disease

## 2016-03-12 ENCOUNTER — Ambulatory Visit (HOSPITAL_COMMUNITY)
Admission: RE | Admit: 2016-03-12 | Discharge: 2016-03-12 | Disposition: A | Discharge status: Home / Self Care | Payer: Medicare Other | Source: Ambulatory Visit | Attending: Cardiology | Admitting: Cardiology

## 2016-03-12 DIAGNOSIS — I10 Essential (primary) hypertension: Secondary | ICD-10-CM | POA: Diagnosis not present

## 2016-03-12 DIAGNOSIS — I2510 Atherosclerotic heart disease of native coronary artery without angina pectoris: Secondary | ICD-10-CM

## 2016-03-12 DIAGNOSIS — I2584 Coronary atherosclerosis due to calcified coronary lesion: Secondary | ICD-10-CM

## 2016-03-12 DIAGNOSIS — R0602 Shortness of breath: Secondary | ICD-10-CM | POA: Diagnosis not present

## 2016-03-12 DIAGNOSIS — Z6835 Body mass index (BMI) 35.0-35.9, adult: Secondary | ICD-10-CM | POA: Diagnosis not present

## 2016-03-12 DIAGNOSIS — I6523 Occlusion and stenosis of bilateral carotid arteries: Secondary | ICD-10-CM | POA: Insufficient documentation

## 2016-03-12 DIAGNOSIS — Z8249 Family history of ischemic heart disease and other diseases of the circulatory system: Secondary | ICD-10-CM | POA: Insufficient documentation

## 2016-03-12 DIAGNOSIS — R0989 Other specified symptoms and signs involving the circulatory and respiratory systems: Secondary | ICD-10-CM

## 2016-03-12 DIAGNOSIS — E785 Hyperlipidemia, unspecified: Secondary | ICD-10-CM | POA: Insufficient documentation

## 2016-03-12 DIAGNOSIS — E669 Obesity, unspecified: Secondary | ICD-10-CM | POA: Insufficient documentation

## 2016-03-12 LAB — MYOCARDIAL PERFUSION IMAGING
LVDIAVOL: 77 mL (ref 46–106)
LVSYSVOL: 31 mL
NUC STRESS TID: 1.05
Peak HR: 81 {beats}/min
Rest HR: 59 {beats}/min
SDS: 4
SRS: 0
SSS: 4

## 2016-03-12 MED ORDER — REGADENOSON 0.4 MG/5ML IV SOLN
0.4000 mg | Freq: Once | INTRAVENOUS | Status: AC
Start: 2016-03-12 — End: 2016-03-12
  Administered 2016-03-12: 0.4 mg via INTRAVENOUS

## 2016-03-12 MED ORDER — TECHNETIUM TC 99M TETROFOSMIN IV KIT
10.5000 | PACK | Freq: Once | INTRAVENOUS | Status: AC | PRN
  Administered 2016-03-12: 10.5 via INTRAVENOUS
  Filled 2016-03-12: qty 11

## 2016-03-12 MED ORDER — TECHNETIUM TC 99M TETROFOSMIN IV KIT
28.3000 | PACK | Freq: Once | INTRAVENOUS | Status: AC | PRN
  Administered 2016-03-12: 28.3 via INTRAVENOUS
  Filled 2016-03-12: qty 28

## 2016-03-17 ENCOUNTER — Other Ambulatory Visit: Payer: Self-pay | Admitting: Family Medicine

## 2016-03-17 DIAGNOSIS — Z1231 Encounter for screening mammogram for malignant neoplasm of breast: Secondary | ICD-10-CM

## 2016-03-28 ENCOUNTER — Ambulatory Visit
Admission: RE | Admit: 2016-03-28 | Discharge: 2016-03-28 | Disposition: A | Discharge status: Home / Self Care | Payer: Medicare Other | Source: Ambulatory Visit | Attending: Family Medicine | Admitting: Family Medicine

## 2016-03-28 DIAGNOSIS — Z1231 Encounter for screening mammogram for malignant neoplasm of breast: Secondary | ICD-10-CM | POA: Diagnosis not present

## 2016-06-03 ENCOUNTER — Encounter: Payer: Self-pay | Admitting: Podiatry

## 2016-06-03 ENCOUNTER — Ambulatory Visit (INDEPENDENT_AMBULATORY_CARE_PROVIDER_SITE_OTHER): Payer: Medicare Other | Admitting: Podiatry

## 2016-06-03 ENCOUNTER — Ambulatory Visit (INDEPENDENT_AMBULATORY_CARE_PROVIDER_SITE_OTHER): Payer: Medicare Other

## 2016-06-03 DIAGNOSIS — I10 Essential (primary) hypertension: Secondary | ICD-10-CM | POA: Insufficient documentation

## 2016-06-03 DIAGNOSIS — M898X9 Other specified disorders of bone, unspecified site: Secondary | ICD-10-CM

## 2016-06-03 DIAGNOSIS — I2510 Atherosclerotic heart disease of native coronary artery without angina pectoris: Secondary | ICD-10-CM | POA: Diagnosis not present

## 2016-06-03 DIAGNOSIS — Q828 Other specified congenital malformations of skin: Secondary | ICD-10-CM

## 2016-06-03 DIAGNOSIS — G629 Polyneuropathy, unspecified: Secondary | ICD-10-CM | POA: Insufficient documentation

## 2016-06-03 DIAGNOSIS — I2584 Coronary atherosclerosis due to calcified coronary lesion: Secondary | ICD-10-CM

## 2016-06-03 DIAGNOSIS — R7301 Impaired fasting glucose: Secondary | ICD-10-CM | POA: Insufficient documentation

## 2016-06-03 DIAGNOSIS — J309 Allergic rhinitis, unspecified: Secondary | ICD-10-CM | POA: Insufficient documentation

## 2016-06-03 NOTE — Progress Notes (Signed)
   Subjective:    Patient ID: Melissa Lane, female    DOB: [birth year], 82 y.o.   MRN: [id number]  HPI: She presents today with a chief complaint of what she thinks is a wart to the medial aspect of the fifth digit of the left foot. Denies any trauma. States that it feels better when his trimmed down.    Review of Systems  All other systems reviewed and are negative.      Objective:   Physical Exam: Vital signs are stable she is alert and oriented 3 pulses are palpable. Neurologic sensorium is intact. Deep tendon reflex are intact muscle strength is normal bilateral. Orthopedic evaluationadductovarus rotated hammertoe deformity fifth digit of the left foot. Radiographs taken today demonstrate a spur to the distal phalanx medial aspect fifth digit left foot. Cutaneous evaluation of his trace reactive hyperkeratosis to the medial aspect distal phalanx fifth digit left foot. No open lesions or wounds are noted.          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  Plan: Debridement of reactive hyperkeratosis for her today placed padding discussed possible surgical intervention.

## 2016-08-12 DIAGNOSIS — Z23 Encounter for immunization: Secondary | ICD-10-CM | POA: Diagnosis not present

## 2016-09-29 DIAGNOSIS — E785 Hyperlipidemia, unspecified: Secondary | ICD-10-CM | POA: Diagnosis not present

## 2016-09-29 DIAGNOSIS — Z0000 Encounter for general adult medical examination without abnormal findings: Secondary | ICD-10-CM | POA: Diagnosis not present

## 2016-09-29 DIAGNOSIS — I10 Essential (primary) hypertension: Secondary | ICD-10-CM | POA: Diagnosis not present

## 2016-09-29 DIAGNOSIS — E669 Obesity, unspecified: Secondary | ICD-10-CM | POA: Diagnosis not present

## 2016-09-29 DIAGNOSIS — Z6836 Body mass index (BMI) 36.0-36.9, adult: Secondary | ICD-10-CM | POA: Diagnosis not present

## 2017-02-23 DIAGNOSIS — K5909 Other constipation: Secondary | ICD-10-CM | POA: Diagnosis not present

## 2017-03-24 ENCOUNTER — Other Ambulatory Visit: Payer: Self-pay | Admitting: Physician Assistant

## 2017-03-24 DIAGNOSIS — Z1231 Encounter for screening mammogram for malignant neoplasm of breast: Secondary | ICD-10-CM

## 2017-03-25 NOTE — Progress Notes (Signed)
Cardiology Office Note   Date:  03/26/2017   ID:  Melissa, Lane 09/01/1934, MRN [id number]  PCP:  Michel Harrow, PA-C  Cardiologist:   Minus Breeding, MD   Chief Complaint  Patient presents with  . Shortness of Breath     History of Present Illness: Melissa Lane is a 83 y.o. female who presents for evaluation of coronary calcium. This was noted when she had a CT scan. This was done to evaluate abdominal pain.  The report suggested possible cardiomegaly. There was noted to be atherosclerosis in the aorta and iliacs. There was mention of coronary calcium.  Follow up Lexiscan Myoview was negative for ischemia.  She returns for follow up.  Since I last saw her she has done well.  The patient denies any new symptoms such as chest discomfort, neck or arm discomfort. There has been no new shortness of breath, PND or orthopnea. There have been no reported palpitations, presyncope or syncope.  She does have some DOE.  This is not changed from previous.  She is not exercising.     Past Medical History:  Diagnosis Date  . Hyperlipidemia   . Hypertension   . Peri-rectal abscess 2013    Past Surgical History:  Procedure Laterality Date  . ABDOMINAL HYSTERECTOMY  1978   Partial  . CATARACT EXTRACTION       Current Outpatient Prescriptions  Medication Sig Dispense Refill  . atorvastatin (LIPITOR) 40 MG tablet Take 40 mg by mouth daily.    Marland Kitchen losartan-hydrochlorothiazide (HYZAAR) 100-12.5 MG per tablet Take 1 tablet by mouth daily.    . Multiple Vitamin (MULTIVITAMIN) capsule      No current facility-administered medications for this visit.     Allergies:   Macrodantin [nitrofurantoin macrocrystal]; Naproxen; Vitamin d; and Vitamin d analogs    ROS:  Please see the history of present illness.   Otherwise, review of systems are positive for none.   All other systems are reviewed and negative.    PHYSICAL EXAM: VS:  BP 126/70   Pulse 74   Ht 5\' 6"  (1.676 m)   Wt 227 lb  (103 kg)   BMI 36.64 kg/m  , BMI Body mass index is 36.64 kg/m.  GENERAL:  Well appearing NECK:  No jugular venous distention, waveform within normal limits, carotid upstroke brisk and symmetric, no bruits, no thyromegaly LUNGS:  Clear to auscultation bilaterally CHEST:  Unremarkable HEART:  PMI not displaced or sustained,S1 and S2 within normal limits, no S3, no S4, no clicks, no rubs, no murmurs ABD:  Flat, positive bowel sounds normal in frequency in pitch, no bruits, no rebound, no guarding, no midline pulsatile mass, no hepatomegaly, no splenomegaly EXT:  2 plus pulses throughout, no edema, no cyanosis no clubbing   EKG:  EKG is  ordered today. The ekg ordered today demonstrates sinus rhythm, rate 74, axis within normal limits, intervals within normal limits, no acute ST-T wave changes.   Recent Labs: No results found for requested labs within last 8760 hours.    Lipid Panel No results found for: CHOL, TRIG, HDL, CHOLHDL, VLDL, LDLCALC, LDLDIRECT    Wt Readings from Last 3 Encounters:  03/26/17 227 lb (103 kg)  03/12/16 220 lb (99.8 kg)  02/21/16 220 lb 3.2 oz (99.9 kg)      Other studies Reviewed: Additional studies/ records that were reviewed today include: labs Review of the above records demonstrates:    ASSESSMENT AND PLAN:  CORONARY  CALCIUM:   There was no evidence of ischemia on perfusion study last year.  I would continue with risk reduction and will plan a POET (Plain Old Exercise Treadmill) next year.   CARDIOMEGALY:  There was a question of this on CT.   BNP was normal at the last visit.  I did not clinically suspect CHF.  No further work up is planned.  She has no new symptoms.   DYSLIPIDEMIA:  Her lipids are excellent.  LDL 76 and HDL 54 in January. No change in therapy is indicated She will continue the meds as listed.    HTN:   Her BP is controlled.  She will continue the meds as listed.   BRUIT:  She had minimal plaque in 2017.    WEIGHT:  We had  a discussion about this today.    Current medicines are reviewed at length with the patient today.  The patient does not have concerns regarding medicines.  The following changes have been made: None  Labs/ tests ordered today include:  None  Orders Placed This Encounter  Procedures  . EXERCISE TOLERANCE TEST  . EKG 12-Lead     Disposition:   FU with POET (Plain Old Exercise Treadmill) in one year. Movement      Signed, Minus Breeding, MD  03/26/2017 9:37 PM    [place]

## 2017-03-26 ENCOUNTER — Encounter: Payer: Self-pay | Admitting: Cardiology

## 2017-03-26 ENCOUNTER — Ambulatory Visit (INDEPENDENT_AMBULATORY_CARE_PROVIDER_SITE_OTHER): Payer: Medicare Other | Admitting: Cardiology

## 2017-03-26 VITALS — BP 126/70 | HR 74 | Ht 66.0 in | Wt 227.0 lb

## 2017-03-26 DIAGNOSIS — I2510 Atherosclerotic heart disease of native coronary artery without angina pectoris: Secondary | ICD-10-CM

## 2017-03-26 DIAGNOSIS — I10 Essential (primary) hypertension: Secondary | ICD-10-CM | POA: Diagnosis not present

## 2017-03-26 DIAGNOSIS — E785 Hyperlipidemia, unspecified: Secondary | ICD-10-CM | POA: Diagnosis not present

## 2017-03-26 DIAGNOSIS — R931 Abnormal findings on diagnostic imaging of heart and coronary circulation: Secondary | ICD-10-CM | POA: Diagnosis not present

## 2017-03-26 NOTE — Patient Instructions (Signed)
Medication Instructions:  Your physician recommends that you continue on your current medications as directed. Please refer to the Current Medication list given to you today.  Labwork: NONE  Testing/Procedures: Your physician has requested that you have an exercise tolerance test. For further information please visit [email or web address]. Please also follow instruction sheet, as given.   Follow-Up: Your physician wants you to follow-up in: 1 year with Dr. Percival Spanish. You will receive a reminder letter in the mail two months in advance. If you don't receive a letter, please call our office to schedule the follow-up appointment.   Any Other Special Instructions Will Be Listed Below (If Applicable).     If you need a refill on your cardiac medications before your next appointment, please call your pharmacy.

## 2017-03-30 ENCOUNTER — Ambulatory Visit
Admission: RE | Admit: 2017-03-30 | Discharge: 2017-03-30 | Disposition: A | Discharge status: Home / Self Care | Payer: Medicare Other | Source: Ambulatory Visit | Attending: Physician Assistant | Admitting: Physician Assistant

## 2017-03-30 DIAGNOSIS — Z1231 Encounter for screening mammogram for malignant neoplasm of breast: Secondary | ICD-10-CM | POA: Diagnosis not present

## 2017-04-10 ENCOUNTER — Telehealth (HOSPITAL_COMMUNITY): Payer: Self-pay

## 2017-04-10 NOTE — Telephone Encounter (Signed)
Encounter complete. 

## 2017-04-15 ENCOUNTER — Ambulatory Visit (HOSPITAL_COMMUNITY)
Admission: RE | Admit: 2017-04-15 | Discharge: 2017-04-15 | Disposition: A | Discharge status: Home / Self Care | Payer: Medicare Other | Source: Ambulatory Visit | Attending: Cardiovascular Disease | Admitting: Cardiovascular Disease

## 2017-04-15 DIAGNOSIS — R931 Abnormal findings on diagnostic imaging of heart and coronary circulation: Secondary | ICD-10-CM | POA: Diagnosis not present

## 2017-04-15 LAB — EXERCISE TOLERANCE TEST
CHL CUP MPHR: 139 {beats}/min
CSEPHR: 96 %
CSEPPHR: 134 {beats}/min
Estimated workload: 5 METS
Exercise duration (min): 3 min
Exercise duration (sec): 21 s
RPE: 18
Rest HR: 68 {beats}/min

## 2017-04-20 ENCOUNTER — Telehealth: Payer: Self-pay | Admitting: Cardiology

## 2017-04-20 NOTE — Telephone Encounter (Signed)
Notes recorded by Minus Breeding, MD on 04/18/2017 at 8:27 PM EDT No evidence of ischemia.  Call Ms. Kain with the results and send results to Michel Harrow, PA-C  Patient aware of results.

## 2017-04-20 NOTE — Telephone Encounter (Signed)
Pt would like her Stress Test results from 04-15-17 please.Marland Kitchen

## 2017-07-11 DIAGNOSIS — J329 Chronic sinusitis, unspecified: Secondary | ICD-10-CM | POA: Diagnosis not present

## 2017-07-15 DIAGNOSIS — J329 Chronic sinusitis, unspecified: Secondary | ICD-10-CM | POA: Diagnosis not present

## 2017-07-29 DIAGNOSIS — Z23 Encounter for immunization: Secondary | ICD-10-CM | POA: Diagnosis not present

## 2017-09-09 DIAGNOSIS — E785 Hyperlipidemia, unspecified: Secondary | ICD-10-CM | POA: Diagnosis not present

## 2017-09-09 DIAGNOSIS — R7301 Impaired fasting glucose: Secondary | ICD-10-CM | POA: Diagnosis not present

## 2017-09-09 DIAGNOSIS — I10 Essential (primary) hypertension: Secondary | ICD-10-CM | POA: Diagnosis not present

## 2017-09-09 DIAGNOSIS — E669 Obesity, unspecified: Secondary | ICD-10-CM | POA: Diagnosis not present

## 2017-09-09 DIAGNOSIS — J309 Allergic rhinitis, unspecified: Secondary | ICD-10-CM | POA: Diagnosis not present

## 2017-09-20 DIAGNOSIS — R300 Dysuria: Secondary | ICD-10-CM | POA: Diagnosis not present

## 2017-09-20 DIAGNOSIS — N3001 Acute cystitis with hematuria: Secondary | ICD-10-CM | POA: Diagnosis not present

## 2017-12-02 DIAGNOSIS — M19011 Primary osteoarthritis, right shoulder: Secondary | ICD-10-CM | POA: Diagnosis not present

## 2017-12-02 DIAGNOSIS — M19012 Primary osteoarthritis, left shoulder: Secondary | ICD-10-CM | POA: Diagnosis not present

## 2017-12-23 DIAGNOSIS — M25552 Pain in left hip: Secondary | ICD-10-CM | POA: Diagnosis not present

## 2017-12-23 DIAGNOSIS — M19019 Primary osteoarthritis, unspecified shoulder: Secondary | ICD-10-CM | POA: Diagnosis not present

## 2018-01-02 ENCOUNTER — Other Ambulatory Visit: Payer: Self-pay

## 2018-01-02 ENCOUNTER — Ambulatory Visit (HOSPITAL_COMMUNITY)
Admission: EM | Admit: 2018-01-02 | Discharge: 2018-01-02 | Disposition: A | Discharge status: Home / Self Care | Payer: Medicare Other | Attending: Family Medicine | Admitting: Family Medicine

## 2018-01-02 ENCOUNTER — Encounter (HOSPITAL_COMMUNITY): Payer: Self-pay | Admitting: *Deleted

## 2018-01-02 DIAGNOSIS — J069 Acute upper respiratory infection, unspecified: Secondary | ICD-10-CM

## 2018-01-02 DIAGNOSIS — B9789 Other viral agents as the cause of diseases classified elsewhere: Secondary | ICD-10-CM | POA: Diagnosis not present

## 2018-01-02 MED ORDER — CETIRIZINE HCL 10 MG PO TABS: 10.0000 mg | ORAL_TABLET | Freq: Every day | ORAL | 1 refills | Status: DC

## 2018-01-02 NOTE — ED Triage Notes (Signed)
Cough, ears feels full, nasal drainage, per pt this may have came from her grandson

## 2018-01-02 NOTE — ED Provider Notes (Signed)
[facility]    CSN: [id number] Arrival date & time: 01/02/18  1158     History   Chief Complaint No chief complaint on file.   HPI Melissa Lane is a 84 y.o. female.   84 yo female here for cough and postnasal drainage. She also complains of fullness in her ears x 3 days. Her grandson was sick with viral illness recently. No other known sick contacts. She has been using flonase and this helps some. Nothing makes better or worse.      Past Medical History:  Diagnosis Date  . Hyperlipidemia   . Hypertension   . Peri-rectal abscess 2013    Patient Active Problem List   Diagnosis Date Noted  . Allergic rhinitis 06/03/2016  . Essential hypertension, benign 06/03/2016  . Impaired fasting glucose 06/03/2016  . Peripheral neuropathy 06/03/2016  . Elevated coronary artery calcium score 02/21/2016  . Bruit of right carotid artery 02/21/2016  . Pseudophakia of both eyes 06/01/2015  . 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 Dyslipidemia 05/09/2013  . 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 HTN (hypertension) 09/09/2012  . Mixed hyperlipidemia 09/09/2012  . Perianal abscess 07/26/2012  . Corneal scarring 01/12/2012  . Nuclear cataract 01/12/2012    Past Surgical History:  Procedure Laterality Date  . ABDOMINAL HYSTERECTOMY  1978   Partial  . BREAST BIOPSY Left 07/26/1999  . CATARACT EXTRACTION      OB History   None      Home Medications    Prior to Admission medications   Medication Sig Start Date End Date Taking? Authorizing Provider  atorvastatin (LIPITOR) 40 MG tablet Take 40 mg by mouth daily.   Yes [provider]  celecoxib (CELEBREX) 400 MG capsule Take 400 mg by mouth daily after breakfast.   Yes [provider]  losartan-hydrochlorothiazide (HYZAAR) 100-12.5 MG per tablet Take 1 tablet by mouth daily.   Yes [provider]  Multiple Vitamin (MULTIVITAMIN) capsule    Yes [provider]    Family History Family History    Problem Relation Age of Onset  . Cancer Mother        Breast Cancer  . Alzheimer's disease Mother   . Breast cancer Mother   . Heart attack Father 66  . Crohn's disease Son     Social History Social History   Tobacco Use  . Smoking status: Never Smoker  . Smokeless tobacco: Never Used  Substance Use Topics  . Alcohol use: Yes    Alcohol/week: 0.5 - 1.0 oz    Types: 1 - 2 drink(s) per week  . Drug use: No    Frequency: 2.0 times per week     Allergies   Macrodantin [nitrofurantoin macrocrystal]; Naproxen; Vitamin d; and Vitamin d analogs   Review of Systems Review of Systems  Constitutional: Negative for activity change and appetite change.  HENT: Positive for congestion and rhinorrhea. Negative for ear pain.   Eyes: Negative for discharge and itching.  Respiratory: Negative for apnea and chest tightness.   Cardiovascular: Negative for chest pain and leg swelling.  Gastrointestinal: Negative for abdominal distention and abdominal pain.  Endocrine: Negative for heat intolerance.  Genitourinary: Negative for difficulty urinating and dysuria.  Musculoskeletal: Negative for arthralgias and back pain.  Neurological: Negative for dizziness and headaches.  Hematological: Negative for adenopathy. Does not bruise/bleed easily.     Physical Exam Triage Vital Signs ED Triage Vitals [01/02/18 1211]  Enc Vitals Group     BP  Pulse      Resp      Temp      Temp src      SpO2      Weight      Height      Head Circumference      Peak Flow      Pain Score 0     Pain Loc      Pain Edu?      Excl. in [place]?    No data found.  Updated Vital Signs There were no vitals taken for this visit.  Visual Acuity Right Eye Distance:   Left Eye Distance:   Bilateral Distance:    Right Eye Near:   Left Eye Near:    Bilateral Near:     Physical Exam CONSTITUTIONAL: Well-developed, well-nourished female in no acute distress.  EYES: EOM intact, conjunctivae normal, no  scleral icterus HEAD: Normocephalic, atraumatic ENT: External right and left ear normal, oropharynx has clear postnasal drainage with cobblestoning CARDIOVASCULAR: No cyanosis or edema. 2+ distal pulses.  RESPIRATORY:Effort and breath sounds normal, no problems with respiration noted. GASTROINTESTINAL:Soft, normal bowel sounds, no distention noted.  No tenderness, rebound or guarding.  MUSCULOSKELETAL: Normal range of motion. No tenderness. SKIN: Skin is warm and dry. No rash noted. Not diaphoretic. No erythema. No pallor. [place]: Alert and oriented to person, place, and time. Normal reflexes, muscle tone, coordination. No cranial nerve deficit noted. PSYCHIATRIC: Normal mood and affect. Normal behavior. Normal judgment and thought content. HEM/LYMPH/IMMUNOLOGIC: Neck supple, no masses.  Normal thyroid.    UC Treatments / Results  Labs (all labs ordered are listed, but only abnormal results are displayed) Labs Reviewed - No data to display  EKG None Radiology No results found.  Procedures Procedures (including critical care time)  Medications Ordered in UC Medications - No data to display   Initial Impression / Assessment and Plan / UC Course  I have reviewed the triage vital signs and the nursing notes.  Pertinent labs & imaging results that were available during my care of the patient were reviewed by me and considered in my medical decision making (see chart for details).     1. Viral URI: most likely given sick contact but also consider allergic rhinitis. Supportive care advised.  Final Clinical Impressions(s) / UC Diagnoses   Final diagnoses:  None    ED Discharge Orders    None       Controlled Substance Prescriptions [place] Controlled Substance Registry consulted? Not Applicable   Dannielle Huh, DO 01/02/18 1235

## 2018-02-22 DIAGNOSIS — H903 Sensorineural hearing loss, bilateral: Secondary | ICD-10-CM | POA: Diagnosis not present

## 2018-02-22 DIAGNOSIS — Z461 Encounter for fitting and adjustment of hearing aid: Secondary | ICD-10-CM | POA: Diagnosis not present

## 2018-02-25 DIAGNOSIS — Z6836 Body mass index (BMI) 36.0-36.9, adult: Secondary | ICD-10-CM | POA: Diagnosis not present

## 2018-02-25 DIAGNOSIS — K5792 Diverticulitis of intestine, part unspecified, without perforation or abscess without bleeding: Secondary | ICD-10-CM | POA: Diagnosis not present

## 2018-02-27 DIAGNOSIS — T7840XA Allergy, unspecified, initial encounter: Secondary | ICD-10-CM | POA: Diagnosis not present

## 2018-02-27 DIAGNOSIS — K5792 Diverticulitis of intestine, part unspecified, without perforation or abscess without bleeding: Secondary | ICD-10-CM | POA: Diagnosis not present

## 2018-03-01 ENCOUNTER — Ambulatory Visit
Admission: RE | Admit: 2018-03-01 | Discharge: 2018-03-01 | Disposition: A | Discharge status: Home / Self Care | Payer: Medicare Other | Source: Ambulatory Visit | Attending: Family Medicine | Admitting: Family Medicine

## 2018-03-01 ENCOUNTER — Other Ambulatory Visit: Payer: Self-pay | Admitting: Family Medicine

## 2018-03-01 DIAGNOSIS — R1032 Left lower quadrant pain: Secondary | ICD-10-CM

## 2018-03-01 DIAGNOSIS — K136 Irritative hyperplasia of oral mucosa: Secondary | ICD-10-CM | POA: Diagnosis not present

## 2018-03-01 DIAGNOSIS — R197 Diarrhea, unspecified: Secondary | ICD-10-CM | POA: Diagnosis not present

## 2018-03-01 DIAGNOSIS — K5730 Diverticulosis of large intestine without perforation or abscess without bleeding: Secondary | ICD-10-CM | POA: Diagnosis not present

## 2018-03-01 MED ORDER — IOHEXOL 300 MG/ML  SOLN
125.0000 mL | Freq: Once | INTRAMUSCULAR | Status: AC | PRN
  Administered 2018-03-01: 125 mL via INTRAVENOUS

## 2018-03-03 DIAGNOSIS — R197 Diarrhea, unspecified: Secondary | ICD-10-CM | POA: Diagnosis not present

## 2018-03-04 DIAGNOSIS — K136 Irritative hyperplasia of oral mucosa: Secondary | ICD-10-CM | POA: Diagnosis not present

## 2018-03-05 DIAGNOSIS — K1379 Other lesions of oral mucosa: Secondary | ICD-10-CM | POA: Diagnosis not present

## 2018-03-05 DIAGNOSIS — T3695XS Adverse effect of unspecified systemic antibiotic, sequela: Secondary | ICD-10-CM | POA: Diagnosis not present

## 2018-03-08 DIAGNOSIS — L270 Generalized skin eruption due to drugs and medicaments taken internally: Secondary | ICD-10-CM | POA: Diagnosis not present

## 2018-04-05 DIAGNOSIS — I10 Essential (primary) hypertension: Secondary | ICD-10-CM | POA: Diagnosis not present

## 2018-04-05 DIAGNOSIS — R197 Diarrhea, unspecified: Secondary | ICD-10-CM | POA: Diagnosis not present

## 2018-04-05 DIAGNOSIS — E785 Hyperlipidemia, unspecified: Secondary | ICD-10-CM | POA: Diagnosis not present

## 2018-04-05 DIAGNOSIS — R7301 Impaired fasting glucose: Secondary | ICD-10-CM | POA: Diagnosis not present

## 2018-04-08 DIAGNOSIS — L270 Generalized skin eruption due to drugs and medicaments taken internally: Secondary | ICD-10-CM | POA: Diagnosis not present

## 2018-04-13 ENCOUNTER — Ambulatory Visit: Payer: Medicare Other | Admitting: Cardiology

## 2018-05-10 DIAGNOSIS — M19019 Primary osteoarthritis, unspecified shoulder: Secondary | ICD-10-CM | POA: Diagnosis not present

## 2018-05-16 NOTE — Progress Notes (Signed)
Cardiology Office Note   Date:  05/17/2018   ID:  [name], [name] [birth year], MRN [id number]  PCP:  Michel Harrow, PA-C  Cardiologist:   Minus Breeding, MD    No chief complaint on file.    History of Present Illness: Melissa Lane is a 84 y.o. female who presents for evaluation of coronary calcium. This was noted when she had a CT scan. This was done to evaluate abdominal pain.  The report suggested possible cardiomegaly. There was noted to be atherosclerosis in the aorta and iliacs.  Follow up Lexiscan Myoview was negative for ischemia in 2017.  In 2018 she had a negative POET (Plain Old Exercise Treadmill) She returns for follow up.   She has been diagnosed with arthritis and has been on Celebrex.  She has had some cortisone shoulder injections.  She denies any cardiovascular symptoms.  She does her activities of daily living which includes stairs and vacuum cleaning.  She does not exercise routinely. The patient denies any new symptoms such as chest discomfort, neck or arm discomfort. There has been no new shortness of breath, PND or orthopnea. There have been no reported palpitations, presyncope or syncope.    Past Medical History:  Diagnosis Date  . Hyperlipidemia   . Hypertension   . Peri-rectal abscess 2013    Past Surgical History:  Procedure Laterality Date  . ABDOMINAL HYSTERECTOMY  1978   Partial  . BREAST BIOPSY Left 07/26/1999  . CATARACT EXTRACTION       Current Outpatient Medications  Medication Sig Dispense Refill  . aspirin EC 81 MG tablet Take 81 mg by mouth daily.    Marland Kitchen atorvastatin (LIPITOR) 40 MG tablet Take 40 mg by mouth daily.    . celecoxib (CELEBREX) 400 MG capsule Take 400 mg by mouth daily after breakfast.    . losartan-hydrochlorothiazide (HYZAAR) 100-12.5 MG per tablet Take 1 tablet by mouth daily.    . Multiple Vitamin (MULTIVITAMIN) capsule      No current facility-administered medications for this visit.     Allergies:    Macrodantin [nitrofurantoin macrocrystal]; Naproxen; Vitamin d; and Vitamin d analogs    ROS:  Please see the history of present illness.   Otherwise, review of systems are positive for none.   All other systems are reviewed and negative.    PHYSICAL EXAM: VS:  BP 134/72   Pulse 80   Ht 5\' 6"  (1.676 m)   Wt 225 lb (102.1 kg)   BMI 36.32 kg/m  , BMI Body mass index is 36.32 kg/m.  GENERAL:  Well appearing NECK:  No jugular venous distention, waveform within normal limits, carotid upstroke brisk and symmetric, no bruits, no thyromegaly LUNGS:  Clear to auscultation bilaterally CHEST:  Unremarkable HEART:  PMI not displaced or sustained,S1 and S2 within normal limits, no S3, no S4, no clicks, no rubs, no murmurs ABD:  Flat, positive bowel sounds normal in frequency in pitch, no bruits, no rebound, no guarding, no midline pulsatile mass, no hepatomegaly, no splenomegaly EXT:  2 plus pulses throughout, no edema, no cyanosis no clubbing    EKG:  EKG is   ordered today. The ekg ordered today demonstrates sinus rhythm, rate 80, axis within normal limits, intervals within normal limits, no acute ST-T wave changes.   Recent Labs: No results found for requested labs within last 8760 hours.    Lipid Panel See below  Wt Readings from Last 3 Encounters:  05/17/18 225 lb (  102.1 kg)  03/26/17 227 lb (103 kg)  03/12/16 220 lb (99.8 kg)      Other studies Reviewed: Additional studies/ records that were reviewed today include:  None Review of the above records demonstrates:    ASSESSMENT AND PLAN:  CORONARY CALCIUM:   There was no evidence of ischemia on POET (Plain Old Exercise Treadmill) study last year.  No further testing is indicated here.  DYSLIPIDEMIA:  Her lipids are excellent with an LDL of 67 and HDL of 55.  No change in therapy.  HTN:   Her BP is controlled.  She will continue the meds as listed.   BRUIT:  She had minimal plaque in 2017.  No further imaging is  indicated.  WEIGHT: She and I have talked about this.  I prescribed exercise.   Current medicines are reviewed at length with the patient today.  The patient does not have concerns regarding medicines.  The following changes have been made: none  Labs/ tests ordered today include:  None  No orders of the defined types were placed in this encounter.    Disposition:  Follow up in one year.    Signed, Minus Breeding, MD  05/17/2018 9:53 AM    [facility]

## 2018-05-17 ENCOUNTER — Ambulatory Visit (INDEPENDENT_AMBULATORY_CARE_PROVIDER_SITE_OTHER): Payer: Medicare Other | Admitting: Cardiology

## 2018-05-17 ENCOUNTER — Encounter: Payer: Self-pay | Admitting: Cardiology

## 2018-05-17 VITALS — BP 134/72 | HR 80 | Ht 66.0 in | Wt 225.0 lb

## 2018-05-17 DIAGNOSIS — R931 Abnormal findings on diagnostic imaging of heart and coronary circulation: Secondary | ICD-10-CM | POA: Diagnosis not present

## 2018-05-17 DIAGNOSIS — I10 Essential (primary) hypertension: Secondary | ICD-10-CM | POA: Diagnosis not present

## 2018-05-17 NOTE — Patient Instructions (Signed)
Medication Instructions:  Continue current medications  If you need a refill on your cardiac medications before your next appointment, please call your pharmacy.  Labwork: None Ordered   Testing/Procedures: None ordered  Follow-Up: Your physician wants you to follow-up in: 1 Year. You should receive a reminder letter in the mail two months in advance. If you do not receive a letter, please call our office in [phone] to schedule your follow-up appointment.     Thank you for choosing [facility] at [facility]!!

## 2018-05-23 DIAGNOSIS — I471 Supraventricular tachycardia, unspecified: Secondary | ICD-10-CM

## 2018-05-23 HISTORY — DX: Supraventricular tachycardia, unspecified: I47.10

## 2018-05-23 HISTORY — DX: Supraventricular tachycardia: I47.1

## 2018-06-02 ENCOUNTER — Telehealth: Payer: Self-pay | Admitting: Cardiology

## 2018-06-02 NOTE — Telephone Encounter (Signed)
Spoke with pt and yesterday and this morning had a sensation to left chest that pt describes as a fan turning on and off a whirling sensation no other symptoms.Per pt labs were checked in July and were normal . Instructed pt to try and use B/p cuff when episode occurs to see if may get a heart rate reading. Will forward to Dr Percival Spanish for review .Adonis Housekeeper

## 2018-06-02 NOTE — Telephone Encounter (Signed)
New Message   Patient is calling because she felt last night in her chest and feels like a motor is running. She says it comes and go. She is not experiencing any shortness of breath, nausea or sweating. Please call to discuss.

## 2018-06-03 ENCOUNTER — Ambulatory Visit (INDEPENDENT_AMBULATORY_CARE_PROVIDER_SITE_OTHER): Payer: Medicare Other | Admitting: Physician Assistant

## 2018-06-03 ENCOUNTER — Encounter: Payer: Self-pay | Admitting: Physician Assistant

## 2018-06-03 VITALS — BP 138/76 | HR 84 | Ht 66.0 in | Wt 227.0 lb

## 2018-06-03 DIAGNOSIS — R0789 Other chest pain: Secondary | ICD-10-CM | POA: Diagnosis not present

## 2018-06-03 DIAGNOSIS — R931 Abnormal findings on diagnostic imaging of heart and coronary circulation: Secondary | ICD-10-CM | POA: Diagnosis not present

## 2018-06-03 DIAGNOSIS — R002 Palpitations: Secondary | ICD-10-CM

## 2018-06-03 NOTE — Progress Notes (Signed)
Cardiology Office Note   Date:  06/03/2018   ID:  Melissa Lane, DOB 1934/01/09, MRN [id number]  PCP:  Michel Harrow, PA-C  Cardiologist: Dr. Percival Spanish, 05/17/2018 Rosaria Ferries, PA-C    History of Present Illness: Melissa Lane is a 83 y.o. female with a history of coronary calcium seen on CT, Myoview NL 2017, neg POET 2018, HTN, HLD, OA  9/11 and 9/12 phone notes regarding palpitations, appointment made  Melissa Lane presents for cardiology evaluation.  When she got up Tuesday am, she was having a "whirring" feeling in her chest. She does not know if it was deep or superficial. Episodes off/on all day long. Just a few minutes between episodes. No associated sx.  Fewer episodes Weds.  Today, when she would get up and walk, she feels a rumble in her chest. Was feeling this in the office, HR was regular at the time.  No more whirring.  She drinks some coffee and tea every day, no recent change. No recent illnesses. No sig ETOH, no OTC cold meds.   Does not exercise but stays busy on a daily basis.   Never gets chest pain. Does not wake w/ LE edema, gets it when she flies. Denies orthopnea or PND. Noturia 1-2 x night.    Past Medical History:  Diagnosis Date  . Coronary artery calcification seen on CAT scan 2016  . Hyperlipidemia   . Hypertension   . Peri-rectal abscess 2013    Past Surgical History:  Procedure Laterality Date  . ABDOMINAL HYSTERECTOMY  1978   Partial  . BREAST BIOPSY Left 07/26/1999  . CATARACT EXTRACTION      Current Outpatient Medications  Medication Sig Dispense Refill  . aspirin EC 81 MG tablet Take 81 mg by mouth daily.    Marland Kitchen atorvastatin (LIPITOR) 40 MG tablet Take 40 mg by mouth daily.    . celecoxib (CELEBREX) 400 MG capsule Take 400 mg by mouth daily after breakfast.    . losartan-hydrochlorothiazide (HYZAAR) 100-12.5 MG per tablet Take 1 tablet by mouth daily.    . Multiple Vitamin (MULTIVITAMIN) capsule Take 1 capsule by mouth  daily.     No current facility-administered medications for this visit.     Allergies:   Macrodantin [nitrofurantoin macrocrystal]; Naproxen; Vitamin d; and Vitamin d analogs    Social History:  The patient  reports that she has never smoked. She has never used smokeless tobacco. She reports that she drinks about 1.0 - 2.0 standard drinks of alcohol per week. She reports that she does not use drugs.   Family History:  The patient's family history includes Alzheimer's disease in her mother; Breast cancer in her mother; Crohn's disease in her son; Heart attack (age of onset: 10) in her father.    ROS:  Please see the history of present illness. All other systems are reviewed and negative.    PHYSICAL EXAM: VS:  BP 138/76   Pulse 84   Ht 5\' 6"  (1.676 m)   Wt 227 lb (103 kg)   SpO2 97%   BMI 36.64 kg/m  , BMI Body mass index is 36.64 kg/m. GEN: Well nourished, well developed, female in no acute distress  HEENT: normal for age  Neck: no JVD, no carotid bruit, no masses Cardiac: RRR; no murmur, no rubs, or gallops Respiratory:  clear to auscultation bilaterally, normal work of breathing GI: soft, nontender, nondistended, + BS MS: no deformity or atrophy; no edema; distal pulses are  2+ in all 4 extremities   Skin: warm and dry, no rash Neuro:  Strength and sensation are intact Psych: euthymic mood, full affect   EKG:  EKG is ordered today. The ekg ordered today demonstrates sinus rhythm, heart rate 84, no acute ischemic changes and no pathologic Q waves.  Normal intervals   Recent Labs: No results found for requested labs within last 8760 hours.    Lipid Panel No results found for: CHOL, TRIG, HDL, CHOLHDL, VLDL, LDLCALC, LDLDIRECT   Wt Readings from Last 3 Encounters:  06/03/18 227 lb (103 kg)  05/17/18 225 lb (102.1 kg)  03/26/17 227 lb (103 kg)     Other studies Reviewed: Additional studies/ records that were reviewed today include: Office notes, hospital records  and testing.  ASSESSMENT AND PLAN:  1.  Atypical chest pain: She is very concerned about the possibility that she has coronary artery disease.  It has been 2 years since his stress test, we will check a treadmill Myoview and follow-up on the results.  2.  Palpitations: I explained that when I felt her pulse during 1 of her episodes, her heart rate felt regular.  I explained this is very reassuring.  She agrees, but is still worried.  We will get a ZIO monitor to further define her symptoms.   Current medicines are reviewed at length with the patient today.  The patient does not have concerns regarding medicines.  The following changes have been made:  no change  Labs/ tests ordered today include:  No orders of the defined types were placed in this encounter.   Disposition:   FU with Dr. Percival Spanish  Signed, Rosaria Ferries, PA-C  06/03/2018 10:45 AM    [place] Phone: [phone]; Fax: [phone]  This note was written with the assistance of speech recognition software. Please excuse any transcriptional errors.

## 2018-06-03 NOTE — Patient Instructions (Addendum)
Medication Instructions:  Your physician recommends that you continue on your current medications as directed. Please refer to the Current Medication list given to you today.  Testing/Procedures: Your physician has requested that you have an exercise stress myoview. For further information please visit HugeFiesta.[email or web address]. Please follow instruction sheet, as given.  Your physician has recommended that you wear a 14 day event monitor (Zio patch). Event monitors are medical devices that record the heart's electrical activity. Doctors most often [place] these monitors to diagnose arrhythmias. Arrhythmias are problems with the speed or rhythm of the heartbeat. The monitor is a small, portable device. You can wear one while you do your normal daily activities. This is usually used to diagnose what is causing palpitations/syncope (passing out).  Follow-Up: After testing with Rosaria Ferries PA  Any Other Special Instructions Will Be Listed Below (If Applicable).     If you need a refill on your cardiac medications before your next appointment, please call your pharmacy.

## 2018-06-03 NOTE — Telephone Encounter (Signed)
Patient called in again.   She reports all day Tuesday she felt a "whirring" feeling her chest like a fan.   She feels that her symptoms are a build up to something worse.   She denies SOB  BP - HR readings: 149/71 - 86 138/63 (normal) - 86 145/73 - 68  Patient still c/o of whirring feeling in her chest, does not wake her up.   She is wanting to be seen today. Scheduled for 1030 with [facility]

## 2018-06-04 ENCOUNTER — Ambulatory Visit (INDEPENDENT_AMBULATORY_CARE_PROVIDER_SITE_OTHER): Payer: Medicare Other

## 2018-06-04 DIAGNOSIS — R002 Palpitations: Secondary | ICD-10-CM

## 2018-06-04 DIAGNOSIS — R079 Chest pain, unspecified: Secondary | ICD-10-CM | POA: Diagnosis not present

## 2018-06-10 ENCOUNTER — Telehealth: Payer: Self-pay

## 2018-06-10 NOTE — Telephone Encounter (Signed)
-----   Message from Lonn Georgia, PA-C sent at 06/04/2018  5:40 PM EDT ----- I got a message from [facility] that she might not be compliant with her metoprolol or labetalol. At a recent office visit with her PCP, her heart rate was 86. Please contact her and make sure she is taking the metoprolol daily. Can you send a message to [facility] and let them know she should not be taking the labetalol. Thanks

## 2018-06-10 NOTE — Telephone Encounter (Signed)
Called [facility] and they pharmacist stated they did not have a record of this patient using their pharmacy.

## 2018-06-14 NOTE — Telephone Encounter (Signed)
Left message for patient to contact office-need to verify pharmacy.  [facility] since patient has on chart as current pharmacy.-pharmacy stated patient is not currently taking either med because they do not have that med on her chart.   I will speak with Suanne Marker and get correct dose of Metoprolo to send to pharmacy.

## 2018-06-23 DIAGNOSIS — R002 Palpitations: Secondary | ICD-10-CM | POA: Diagnosis not present

## 2018-06-23 DIAGNOSIS — R079 Chest pain, unspecified: Secondary | ICD-10-CM | POA: Diagnosis not present

## 2018-07-05 ENCOUNTER — Ambulatory Visit (INDEPENDENT_AMBULATORY_CARE_PROVIDER_SITE_OTHER): Payer: Medicare Other | Admitting: Physician Assistant

## 2018-07-05 ENCOUNTER — Encounter: Payer: Self-pay | Admitting: Physician Assistant

## 2018-07-05 VITALS — BP 138/68 | HR 80 | Ht 66.0 in | Wt 230.2 lb

## 2018-07-05 DIAGNOSIS — I471 Supraventricular tachycardia: Secondary | ICD-10-CM

## 2018-07-05 DIAGNOSIS — I10 Essential (primary) hypertension: Secondary | ICD-10-CM | POA: Diagnosis not present

## 2018-07-05 DIAGNOSIS — R931 Abnormal findings on diagnostic imaging of heart and coronary circulation: Secondary | ICD-10-CM | POA: Diagnosis not present

## 2018-07-05 DIAGNOSIS — R0789 Other chest pain: Secondary | ICD-10-CM

## 2018-07-05 MED ORDER — METOPROLOL SUCCINATE ER 25 MG PO TB24: 25.0000 mg | ORAL_TABLET | Freq: Every day | ORAL | 3 refills | Status: DC

## 2018-07-05 NOTE — Patient Instructions (Signed)
Medication Instructions:  Your physician has recommended you make the following change in your medication:  START Metoprolol 25mg  daily. An Rx has been sent to your pharmacy  If you need a refill on your cardiac medications before your next appointment, please call your pharmacy.   Lab work: None ordered If you have labs (blood work) drawn today and your tests are completely normal, you will receive your results only by: Marland Kitchen MyChart Message (if you have MyChart) OR . A paper copy in the mail If you have any lab test that is abnormal or we need to change your treatment, we will call you to review the results.  Testing/Procedures: Ok to defer having your Myoview stress test  Follow-Up: At [facility], you and your health needs are our priority.  As part of our continuing mission to provide you with exceptional heart care, we have created designated Provider Care Teams.  These Care Teams include your primary Cardiologist (physician) and Advanced Practice Providers (APPs -  Physician Assistants and Nurse Practitioners) who all work together to provide you with the care you need, when you need it. You will need a follow up appointment in 1 year.  Please call our office 2 months in advance to schedule this appointment.  You may see Minus Breeding, MD or one of the following Advanced Practice Providers on your designated Care Team:   Rosaria Ferries, PA-C . Jory Sims, DNP, ANP  Any Other Special Instructions Will Be Listed Below (If Applicable). Avoid or limit your caffeine in take.  Avoid over the counter medications containing Phenylephrine or Pseudoephedrine

## 2018-07-05 NOTE — Progress Notes (Signed)
Cardiology Office Note   Date:  07/05/2018   ID:  Melissa Lane, DOB Jan 17, 1934, MRN [id number]  PCP:  Marda Stalker, PA-C Cardiologist:  Minus Breeding, MD 05/17/2018 Rosaria Ferries, PA-C 06/03/2018  No chief complaint on file.   History of Present Illness: Melissa Lane is a 84 y.o. female with a history of coronary calcium seen on CT, Myoview NL 2017, neg POET 2018, HTN, HLD, OA  09/12 office visit, pt w/ palps and CP>>GXT and ZIO ordered. ZIO w/ brief SVT at times, longest 15 bts, MV not done yet. 34 episodes total. Minimum HR 41, max 222.   Melissa Lane presents for cardiology follow up.  She had no palpitations while wearing the monitor.   She is relieved it was not Afib.  She is very busy but does not exercise. She is able to do clean the house, do sweeping and mopping, vacuum, etc, w/out chest pain or SOB. No LE edema, no orthopnea or PND  1 cup coffee am, 2 glasses of tea pm. No OTC cold meds, no sig chocolate.   Has not been sick since June.   She has not had any severe symptoms since the last visit.  She prefers to treat the SVT and hold off on stress testing at this time.   Past Medical History:  Diagnosis Date  . Coronary artery calcification seen on CAT scan 2016  . Hyperlipidemia   . Hypertension   . Peri-rectal abscess 2013  . SVT (supraventricular tachycardia) ([place]) 05/2018    Past Surgical History:  Procedure Laterality Date  . ABDOMINAL HYSTERECTOMY  1978   Partial  . BREAST BIOPSY Left 07/26/1999  . CATARACT EXTRACTION      Current Outpatient Medications  Medication Sig Dispense Refill  . aspirin EC 81 MG tablet Take 81 mg by mouth daily.    Marland Kitchen atorvastatin (LIPITOR) 40 MG tablet Take 40 mg by mouth daily.    . celecoxib (CELEBREX) 400 MG capsule Take 400 mg by mouth daily after breakfast.    . losartan-hydrochlorothiazide (HYZAAR) 100-12.5 MG per tablet Take 1 tablet by mouth daily.    . Multiple Vitamin (MULTIVITAMIN) capsule Take  1 capsule by mouth daily.     No current facility-administered medications for this visit.     Allergies:   Macrodantin [nitrofurantoin macrocrystal]; Naproxen; Vitamin d; and Vitamin d analogs    Social History:  The patient  reports that she has never smoked. She has never used smokeless tobacco. She reports that she drinks about 1.0 - 2.0 standard drinks of alcohol per week. She reports that she does not use drugs.   Family History:  The patient's family history includes Alzheimer's disease in her mother; Breast cancer in her mother; Crohn's disease in her son; Heart attack (age of onset: 76) in her father.  She indicated that her mother is deceased. She indicated that her father is deceased. She indicated that the status of her son is unknown.    ROS:  Please see the history of present illness. All other systems are reviewed and negative.    PHYSICAL EXAM: VS:  BP 138/68   Pulse 80   Ht 5\' 6"  (1.676 m)   Wt 230 lb 3.2 oz (104.4 kg)   SpO2 96%   BMI 37.16 kg/m  , BMI Body mass index is 37.16 kg/m. GEN: Well nourished, well developed, female in no acute distress HEENT: normal for age  Neck: no JVD, no carotid bruit, no  masses Cardiac: RRR; no murmur, no rubs, or gallops Respiratory:  clear to auscultation bilaterally, normal work of breathing GI: soft, nontender, nondistended, + BS MS: no deformity or atrophy; no edema; distal pulses are 2+ in all 4 extremities; varicose veins noted Skin: warm and dry, no rash Neuro:  Strength and sensation are intact Psych: euthymic mood, full affect   EKG:  EKG is not ordered today.  Exercise Tolerance Test: 04/15/2017 No evidence of ischemia.  Overall, the patient's exercise capacity was moderately impaired.  There was no ST segment deviation noted during stress.  Arrhythmias during stress: none.  Arrhythmias during recovery: none.  There were no significant arrhythmias noted during the test.  ECG was interpretable and  conclusive.  MONITOR: 05/2018 Episodes 34 HR Range 73-222 bpm Avg 116 bpm Patient had a min HR of 45 bpm, max HR of 222 bpm, and avg HR of 70 bpm. Predominant underlying rhythm was Sinus Rhythm. 34 Supraventricular Tachycardia runs occurred, the run with the fastest interval lasting 5 beats with a max rate of 222 bpm, the longest lasting 16 beats with an avg rate of 99 bpm. Isolated SVEs were rare (<1.0%), SVE Couplets were rare (<1.0%), and SVE Triplets were rare (<1.0%). Isolated VEs were rare (<1.0%), VE Couplets were rare (<1.0%), and no VE Triplets were present. Ventricular Trigeminy was present.  Recent Labs: No results found for requested labs within last 8760 hours.  CBC    Component Value Date/Time   WBC 10.6 (A) 09/11/2012 1922   RBC 4.82 09/11/2012 1922   HGB 14.4 09/11/2012 1922   HCT 46.7 09/11/2012 1922   MCV 96.9 09/11/2012 1922   MCH 29.9 09/11/2012 1922   MCHC 30.8 (A) 09/11/2012 1922   No flowsheet data found.   Lipid Panel No results found for: CHOL, TRIG, HDL, CHOLHDL, VLDL, LDLCALC, LDLDIRECT   Wt Readings from Last 3 Encounters:  07/05/18 230 lb 3.2 oz (104.4 kg)  06/03/18 227 lb (103 kg)  05/17/18 225 lb (102.1 kg)     Other studies Reviewed: Additional studies/ records that were reviewed today include: Office notes, hospital records and testing.  ASSESSMENT AND PLAN:  1.  SVT: I explained that we did not see any atrial fibrillation on her monitor, but did see some SVT and other ectopy. -I suspect that she had longer runs giving her the feeling of palpitations. -Because of heart rates as low as 41, I cannot do very much, but will try low-dose metoprolol and see how tolerated. - She continues to have palpitations on the beta-blocker, consider EP referral  2.  Atypical chest pain: She has had no further episodes.  She is encouraged to start regular activity and work up to 30 minutes a day for cardiac fitness and to maintain muscle tone. -Okay to  hold off on testing at this time, no clearly ischemic symptoms  3.  Hypertension: Her blood pressure is minimally above target today, the metoprolol may be enough to get it to goal.   Current medicines are reviewed at length with the patient today.  The patient does not have concerns regarding medicines.  The following changes have been made: Add metoprolol  Labs/ tests ordered today include:  No orders of the defined types were placed in this encounter.    Disposition:   FU with Minus Breeding, MD  Signed, Rosaria Ferries, PA-C  07/05/2018 10:36 AM    [place] Phone: [phone]; Fax: [phone]  This note was  written with the assistance of speech recognition software.  Please excuse any transcriptional errors.

## 2018-07-23 ENCOUNTER — Telehealth: Payer: Self-pay

## 2018-07-23 NOTE — Telephone Encounter (Signed)
New message    Just an FYI.  We have made several attempts to contact this patient including sending a letter to schedule or reschedule their MYOCARDIAL PERFUSION. We will be removing the patient from the WQ.   Thank you 

## 2018-07-23 NOTE — Telephone Encounter (Signed)
Per last PA note, OK to hold off on stress testing.

## 2018-07-30 DIAGNOSIS — M19019 Primary osteoarthritis, unspecified shoulder: Secondary | ICD-10-CM | POA: Diagnosis not present

## 2018-08-02 DIAGNOSIS — Z23 Encounter for immunization: Secondary | ICD-10-CM | POA: Diagnosis not present

## 2018-08-11 DIAGNOSIS — M19011 Primary osteoarthritis, right shoulder: Secondary | ICD-10-CM | POA: Diagnosis not present

## 2018-08-11 DIAGNOSIS — M19012 Primary osteoarthritis, left shoulder: Secondary | ICD-10-CM | POA: Diagnosis not present

## 2018-08-12 ENCOUNTER — Other Ambulatory Visit: Payer: Self-pay | Admitting: Orthopedic Surgery

## 2018-08-12 ENCOUNTER — Telehealth: Payer: Self-pay | Admitting: *Deleted

## 2018-08-12 DIAGNOSIS — M19011 Primary osteoarthritis, right shoulder: Secondary | ICD-10-CM

## 2018-08-12 NOTE — Telephone Encounter (Signed)
PRIMARY CARDIOLOGIST : DR [facility] Pre-operative Risk Assessment    Request for surgical clearance:  1. What type of surgery is being performed?  RIGHT TOTAL SHOULDER  ARTHROPLASTY  2. When is this surgery scheduled?  08/26/2018  3. What type of clearance is required (medical clearance vs. Pharmacy clearance to hold med vs. Both)? MEDICAL  4. Are there any medications that need to be held prior to surgery and how long? ASPIRIN 81 MG  5. Practice name and name of physician performing surgery? [facility]; DR Antony Haste. [place]   6. What is your office phone number [phone]   7.   What is your office fax number [phone]; [place]  8.   Anesthesia type (None, local, MAC, general) ? CHOICE   Melissa Lane 08/12/2018, 4:35 PM  _________________________________________________________________   (provider comments below)

## 2018-08-13 NOTE — Telephone Encounter (Signed)
   Primary Cardiologist: Minus Breeding, MD  Chart reviewed as part of pre-operative protocol coverage. Patient was contacted 08/13/2018 in reference to pre-operative risk assessment for pending surgery as outlined below.  Melissa Lane was last seen on 07/05/18 by Rosaria Ferries, PA.  Since that day, Melissa Lane has done well. She has been seen by [place] for tachy palpitations with no finding of afib on monitoring. She has cut out caffeine and not currently having any symptoms.   Therefore, based on ACC/AHA guidelines, the patient would be at acceptable risk for the planned procedure without further cardiovascular testing.   Pt has been noted to have coronary calcium on CT to evaluate abdominal pain. She had normal myoview and exercise tolerance test. No cardiac events or stents. Aspirin can be held for the procedure.  I will route this recommendation to the requesting party via Epic fax function and remove from pre-op pool.  Please call with questions.  Daune Perch, NP 08/13/2018, 12:00 PM

## 2018-08-17 ENCOUNTER — Ambulatory Visit
Admission: RE | Admit: 2018-08-17 | Discharge: 2018-08-17 | Disposition: A | Discharge status: Home / Self Care | Payer: Medicare Other | Source: Ambulatory Visit | Attending: Orthopedic Surgery | Admitting: Orthopedic Surgery

## 2018-08-17 DIAGNOSIS — M25511 Pain in right shoulder: Secondary | ICD-10-CM | POA: Diagnosis not present

## 2018-08-17 DIAGNOSIS — M19011 Primary osteoarthritis, right shoulder: Secondary | ICD-10-CM

## 2018-08-23 DIAGNOSIS — M19011 Primary osteoarthritis, right shoulder: Secondary | ICD-10-CM | POA: Diagnosis not present

## 2018-08-23 NOTE — Pre-Procedure Instructions (Signed)
AMRIE GURGANUS  08/23/2018      [facility] [place], [place] [address] Phone: [phone] Fax: [facility] [place], [facility] [place] [address] Phone: [phone] Fax: [phone]    Your procedure is scheduled on Thursday, December 5th.  Report to [facility] Admitting at 7:45 A.M.  Call this number if you have problems the morning of surgery:  [phone]   Remember:  Do not eat or drink after midnight.    Take these medicines the morning of surgery with A SIP OF WATER  metoprolol succinate (TOPROL XL)   Follow your surgeon's instructions on when to stop Asprin.  If no instructions were given by your surgeon then you will need to call the office to get those instructions.    As of today, STOP taking any Aspirin(unless otherwise instructed by your surgeon), Aleve, Naproxen, Ibuprofen, Motrin, Advil, Goody's, BC's, all herbal medications, fish oil, and all vitamins. Including: celecoxib (CELEBREX).      Do not wear jewelry, make-up or nail polish.  Do not wear lotions, powders, or perfumes, or deodorant.  Do not shave 48 hours prior to surgery.  Men may shave face and neck.  Do not bring valuables to the hospital.  [facility] is not responsible for any belongings or valuables.  Contacts, dentures or bridgework may not be worn into surgery.  Leave your suitcase in the car.  After surgery it may be brought to your room.  For patients admitted to the hospital, discharge time will be determined by your treatment team.  Patients discharged the day of surgery will not be allowed to drive home.   Special instructions:   [place]- Preparing For Surgery  Before surgery, you can play an important role. Because skin is not sterile, your skin needs to be as free of germs as possible.  You can reduce the number of germs on your skin by washing with CHG (chlorahexidine gluconate) Soap before surgery.  CHG is an antiseptic cleaner which kills germs and bonds with the skin to continue killing germs even after washing.    Oral Hygiene is also important to reduce your risk of infection.  Remember - BRUSH YOUR TEETH THE MORNING OF SURGERY WITH YOUR REGULAR TOOTHPASTE  Please do not use if you have an allergy to CHG or antibacterial soaps. If your skin becomes reddened/irritated stop using the CHG.  Do not shave (including legs and underarms) for at least 48 hours prior to first CHG shower. It is OK to shave your face.  Please follow these instructions carefully.   1. Shower the NIGHT BEFORE SURGERY and the MORNING OF SURGERY with CHG.   2. If you chose to wash your hair, wash your hair first as usual with your normal shampoo.  3. After you shampoo, rinse your hair and body thoroughly to remove the shampoo.  4. Use CHG as you would any other liquid soap. You can apply CHG directly to the skin and wash gently with a scrungie or a clean washcloth.   5. Apply the CHG Soap to your body ONLY FROM THE NECK DOWN.  Do not use on open wounds or open sores. Avoid contact with your eyes, ears, mouth and genitals (private parts). Wash Face and genitals (private parts)  with your normal soap.  6. Wash thoroughly, paying special attention to the area where your surgery will be performed.  7. Thoroughly rinse your body with warm water from the neck down.  8. DO NOT shower/wash with your normal soap after using and rinsing off the CHG Soap.  9. Pat yourself dry with a CLEAN TOWEL.  10. Wear CLEAN PAJAMAS to bed the night before surgery, wear comfortable clothes the morning of surgery  11. Place CLEAN SHEETS on your bed the night of your first shower and DO NOT SLEEP WITH PETS.    Day of Surgery:  Do not apply any deodorants/lotions.  Please wear clean clothes to the hospital/surgery  center.   Remember to brush your teeth WITH YOUR REGULAR TOOTHPASTE.   Please read over the following fact sheets that you were given.

## 2018-08-24 ENCOUNTER — Other Ambulatory Visit: Payer: Self-pay

## 2018-08-24 ENCOUNTER — Encounter (HOSPITAL_COMMUNITY): Payer: Self-pay

## 2018-08-24 ENCOUNTER — Encounter (HOSPITAL_COMMUNITY)
Admission: RE | Admit: 2018-08-24 | Discharge: 2018-08-24 | Disposition: A | Discharge status: Home / Self Care | Payer: Medicare Other | Source: Ambulatory Visit | Attending: Obstetrics & Gynecology | Admitting: Obstetrics & Gynecology

## 2018-08-24 ENCOUNTER — Ambulatory Visit (HOSPITAL_COMMUNITY)
Admission: RE | Admit: 2018-08-24 | Discharge: 2018-08-24 | Disposition: A | Discharge status: Home / Self Care | Payer: Medicare Other | Source: Ambulatory Visit | Attending: Orthopedic Surgery | Admitting: Orthopedic Surgery

## 2018-08-24 DIAGNOSIS — Z01818 Encounter for other preprocedural examination: Secondary | ICD-10-CM

## 2018-08-24 DIAGNOSIS — R079 Chest pain, unspecified: Secondary | ICD-10-CM | POA: Diagnosis not present

## 2018-08-24 HISTORY — DX: Unspecified osteoarthritis, unspecified site: M19.90

## 2018-08-24 LAB — COMPREHENSIVE METABOLIC PANEL
ALT: 19 U/L (ref 0–44)
ANION GAP: 11 (ref 5–15)
AST: 25 U/L (ref 15–41)
Albumin: 4.3 g/dL (ref 3.5–5.0)
Alkaline Phosphatase: 94 U/L (ref 38–126)
BILIRUBIN TOTAL: 0.8 mg/dL (ref 0.3–1.2)
BUN: 24 mg/dL — AB (ref 8–23)
CO2: 23 mmol/L (ref 22–32)
Calcium: 9.5 mg/dL (ref 8.9–10.3)
Chloride: 107 mmol/L (ref 98–111)
Creatinine, Ser: 1.17 mg/dL — ABNORMAL HIGH (ref 0.44–1.00)
GFR calc Af Amer: 50 mL/min — ABNORMAL LOW (ref >60)
GFR, EST NON AFRICAN AMERICAN: 43 mL/min — AB (ref >60)
Glucose, Bld: 124 mg/dL — ABNORMAL HIGH (ref 70–99)
POTASSIUM: 3.4 mmol/L — AB (ref 3.5–5.1)
Sodium: 141 mmol/L (ref 135–145)
TOTAL PROTEIN: 7.1 g/dL (ref 6.5–8.1)

## 2018-08-24 LAB — ABO/RH: ABO/RH(D): B POS

## 2018-08-24 LAB — URINALYSIS, ROUTINE W REFLEX MICROSCOPIC
BILIRUBIN URINE: NEGATIVE
GLUCOSE, UA: NEGATIVE mg/dL
HGB URINE DIPSTICK: NEGATIVE
KETONES UR: NEGATIVE mg/dL
NITRITE: NEGATIVE
PROTEIN: NEGATIVE mg/dL
Specific Gravity, Urine: 1.021 (ref 1.005–1.030)
pH: 5 (ref 5.0–8.0)

## 2018-08-24 LAB — TYPE AND SCREEN
ABO/RH(D): B POS
ANTIBODY SCREEN: NEGATIVE

## 2018-08-24 LAB — CBC WITH DIFFERENTIAL/PLATELET
Abs Immature Granulocytes: 0.02 10*3/uL (ref 0.00–0.07)
Basophils Absolute: 0 10*3/uL (ref 0.0–0.1)
Basophils Relative: 0 %
EOS ABS: 0.1 10*3/uL (ref 0.0–0.5)
EOS PCT: 1 %
HEMATOCRIT: 45.8 % (ref 36.0–46.0)
Hemoglobin: 14.1 g/dL (ref 12.0–15.0)
Immature Granulocytes: 0 %
LYMPHS ABS: 3 10*3/uL (ref 0.7–4.0)
Lymphocytes Relative: 31 %
MCH: 30.5 pg (ref 26.0–34.0)
MCHC: 30.8 g/dL (ref 30.0–36.0)
MCV: 99.1 fL (ref 80.0–100.0)
MONO ABS: 0.5 10*3/uL (ref 0.1–1.0)
MONOS PCT: 5 %
NEUTROS ABS: 6.1 10*3/uL (ref 1.7–7.7)
Neutrophils Relative %: 63 %
Platelets: 254 10*3/uL (ref 150–400)
RBC: 4.62 MIL/uL (ref 3.87–5.11)
RDW: 12.8 % (ref 11.5–15.5)
WBC: 9.8 10*3/uL (ref 4.0–10.5)
nRBC: 0 % (ref 0.0–0.2)

## 2018-08-24 LAB — PROTIME-INR
INR: 1.03
PROTHROMBIN TIME: 13.4 s (ref 11.4–15.2)

## 2018-08-24 LAB — APTT: aPTT: 25 seconds (ref 24–36)

## 2018-08-24 LAB — SURGICAL PCR SCREEN
MRSA, PCR: NEGATIVE
Staphylococcus aureus: POSITIVE — AB

## 2018-08-24 NOTE — Progress Notes (Signed)
Notified Casey with Dr. Bettina Gavia office of patient's UA results. She will let surgeon know.   Jacqlyn Larsen, RN

## 2018-08-24 NOTE — Progress Notes (Signed)
PCP - Marda Stalker, PA-C Cardiologist - Dr. Percival Spanish   Chest x-ray - 08/24/18 EKG - 06/03/18 Stress Test - 04/15/17 ECHO - denies Cardiac Cath - denies  Sleep Study - denies  Aspirin Instructions: Last dose 08/21/18. Instructed to hold 5-7 days prior to surgery.   Anesthesia review: Yes, hx of CAD.  Patient denies shortness of breath, fever, cough and chest pain at PAT appointment   Patient verbalized understanding of instructions that were given to them at the PAT appointment. Patient was also instructed that they will need to review over the PAT instructions again at home before surgery.

## 2018-08-25 MED ORDER — TRANEXAMIC ACID-NACL 1000-0.7 MG/100ML-% IV SOLN
1000.0000 mg | INTRAVENOUS | Status: AC
  Administered 2018-08-26: 1000 mg via INTRAVENOUS
  Filled 2018-08-25: qty 100

## 2018-08-25 NOTE — Anesthesia Preprocedure Evaluation (Addendum)
Anesthesia Evaluation  Patient identified by MRN, date of birth, ID band Patient awake    Reviewed: Allergy & Precautions, NPO status , Patient's Chart, lab work & pertinent test results  Airway Mallampati: II  TM Distance: >3 FB Neck ROM: Full    Dental no notable dental hx.    Pulmonary neg pulmonary ROS,    Pulmonary exam normal breath sounds clear to auscultation       Cardiovascular hypertension, Pt. on medications and Pt. on home beta blockers Normal cardiovascular exam Rhythm:Regular Rate:Normal     Neuro/Psych negative neurological ROS  negative psych ROS   GI/Hepatic negative GI ROS, Neg liver ROS,   Endo/Other  negative endocrine ROS  Renal/GU negative Renal ROS  negative genitourinary   Musculoskeletal negative musculoskeletal ROS (+)   Abdominal   Peds negative pediatric ROS (+)  Hematology negative hematology ROS (+)   Anesthesia Other Findings   Reproductive/Obstetrics negative OB ROS                            Anesthesia Physical Anesthesia Plan  ASA: II  Anesthesia Plan: General   Post-op Pain Management:  Regional for Post-op pain   Induction: Intravenous  PONV Risk Score and Plan: 3 and Ondansetron, Dexamethasone and Treatment may vary due to age or medical condition  Airway Management Planned: Oral ETT  Additional Equipment:   Intra-op Plan:   Post-operative Plan: Extubation in OR  Informed Consent: I have reviewed the patients History and Physical, chart, labs and discussed the procedure including the risks, benefits and alternatives for the proposed anesthesia with the patient or authorized representative who has indicated his/her understanding and acceptance.   Dental advisory given  Plan Discussed with: CRNA and Surgeon  Anesthesia Plan Comments: (Follows with cardiology for tachy palpitations. No finding of afib on monitoring. Myoview normal 2017,  neg POET 2018. Cardiac clearance 08/13/18 by Daune Perch, NP.)       Anesthesia Quick Evaluation

## 2018-08-26 ENCOUNTER — Encounter (HOSPITAL_COMMUNITY): Payer: Self-pay

## 2018-08-26 ENCOUNTER — Encounter (HOSPITAL_COMMUNITY)
Admission: RE | Disposition: A | Discharge status: Home / Self Care | Payer: Self-pay | Source: Ambulatory Visit | Attending: Orthopedic Surgery

## 2018-08-26 ENCOUNTER — Inpatient Hospital Stay (HOSPITAL_COMMUNITY): Payer: Medicare Other

## 2018-08-26 ENCOUNTER — Inpatient Hospital Stay (HOSPITAL_COMMUNITY): Payer: Medicare Other | Admitting: Certified Registered Nurse Anesthetist

## 2018-08-26 ENCOUNTER — Inpatient Hospital Stay (HOSPITAL_COMMUNITY)
Admission: RE | Admit: 2018-08-26 | Discharge: 2018-08-27 | DRG: 483 | Disposition: A | Discharge status: Home / Self Care | Payer: Medicare Other | Source: Ambulatory Visit | Attending: Orthopedic Surgery | Admitting: Orthopedic Surgery

## 2018-08-26 ENCOUNTER — Inpatient Hospital Stay (HOSPITAL_COMMUNITY): Payer: Medicare Other | Admitting: Physician Assistant

## 2018-08-26 DIAGNOSIS — Z881 Allergy status to other antibiotic agents status: Secondary | ICD-10-CM | POA: Diagnosis not present

## 2018-08-26 DIAGNOSIS — I10 Essential (primary) hypertension: Secondary | ICD-10-CM | POA: Diagnosis present

## 2018-08-26 DIAGNOSIS — M19011 Primary osteoarthritis, right shoulder: Secondary | ICD-10-CM | POA: Diagnosis present

## 2018-08-26 DIAGNOSIS — E785 Hyperlipidemia, unspecified: Secondary | ICD-10-CM | POA: Diagnosis present

## 2018-08-26 DIAGNOSIS — Z7982 Long term (current) use of aspirin: Secondary | ICD-10-CM

## 2018-08-26 DIAGNOSIS — Z886 Allergy status to analgesic agent status: Secondary | ICD-10-CM | POA: Diagnosis not present

## 2018-08-26 DIAGNOSIS — I2510 Atherosclerotic heart disease of native coronary artery without angina pectoris: Secondary | ICD-10-CM | POA: Diagnosis present

## 2018-08-26 DIAGNOSIS — E782 Mixed hyperlipidemia: Secondary | ICD-10-CM | POA: Diagnosis not present

## 2018-08-26 DIAGNOSIS — Z471 Aftercare following joint replacement surgery: Secondary | ICD-10-CM | POA: Diagnosis not present

## 2018-08-26 DIAGNOSIS — Z96611 Presence of right artificial shoulder joint: Secondary | ICD-10-CM | POA: Diagnosis not present

## 2018-08-26 DIAGNOSIS — G8918 Other acute postprocedural pain: Secondary | ICD-10-CM | POA: Diagnosis not present

## 2018-08-26 DIAGNOSIS — Z79899 Other long term (current) drug therapy: Secondary | ICD-10-CM

## 2018-08-26 HISTORY — PX: TOTAL SHOULDER ARTHROPLASTY: SHX126

## 2018-08-26 SURGERY — ARTHROPLASTY, SHOULDER, TOTAL
Anesthesia: General | Laterality: Right

## 2018-08-26 MED ORDER — HYDROCHLOROTHIAZIDE 12.5 MG PO CAPS
12.5000 mg | ORAL_CAPSULE | Freq: Every day | ORAL | Status: DC
  Administered 2018-08-26 – 2018-08-27 (×2): 12.5 mg via ORAL
  Filled 2018-08-26 (×2): qty 1

## 2018-08-26 MED ORDER — DEXAMETHASONE SODIUM PHOSPHATE 10 MG/ML IJ SOLN
INTRAMUSCULAR | Status: DC | PRN
  Administered 2018-08-26: 10 mg via INTRAVENOUS

## 2018-08-26 MED ORDER — MORPHINE SULFATE (PF) 2 MG/ML IV SOLN: 0.5000 mg | INTRAVENOUS | Status: DC | PRN

## 2018-08-26 MED ORDER — DIPHENHYDRAMINE HCL 12.5 MG/5ML PO ELIX: 12.5000 mg | ORAL_SOLUTION | ORAL | Status: DC | PRN

## 2018-08-26 MED ORDER — DEXAMETHASONE SODIUM PHOSPHATE 10 MG/ML IJ SOLN
INTRAMUSCULAR | Status: AC
  Filled 2018-08-26: qty 1

## 2018-08-26 MED ORDER — SODIUM CHLORIDE 0.9 % IV SOLN
INTRAVENOUS | Status: DC | PRN
  Administered 2018-08-26: 25 ug/min via INTRAVENOUS

## 2018-08-26 MED ORDER — LIDOCAINE 2% (20 MG/ML) 5 ML SYRINGE
INTRAMUSCULAR | Status: AC
  Filled 2018-08-26: qty 5

## 2018-08-26 MED ORDER — METOCLOPRAMIDE HCL 5 MG/ML IJ SOLN: 5.0000 mg | Freq: Three times a day (TID) | INTRAMUSCULAR | Status: DC | PRN

## 2018-08-26 MED ORDER — FLEET ENEMA 7-19 GM/118ML RE ENEM: 1.0000 | ENEMA | Freq: Once | RECTAL | Status: DC | PRN

## 2018-08-26 MED ORDER — ONDANSETRON HCL 4 MG/2ML IJ SOLN
INTRAMUSCULAR | Status: AC
  Filled 2018-08-26: qty 2

## 2018-08-26 MED ORDER — LOSARTAN POTASSIUM 50 MG PO TABS
100.0000 mg | ORAL_TABLET | Freq: Every day | ORAL | Status: DC
  Administered 2018-08-26: 100 mg via ORAL
  Filled 2018-08-26 (×2): qty 2

## 2018-08-26 MED ORDER — HEMOSTATIC AGENTS (NO CHARGE) OPTIME
TOPICAL | Status: DC | PRN
  Administered 2018-08-26: 1 via TOPICAL

## 2018-08-26 MED ORDER — METOPROLOL SUCCINATE ER 25 MG PO TB24
25.0000 mg | ORAL_TABLET | Freq: Every day | ORAL | Status: DC
  Filled 2018-08-26: qty 1

## 2018-08-26 MED ORDER — HYDROCODONE-ACETAMINOPHEN 5-325 MG PO TABS: 1.0000 | ORAL_TABLET | ORAL | Status: DC | PRN

## 2018-08-26 MED ORDER — MENTHOL 3 MG MT LOZG: 1.0000 | LOZENGE | OROMUCOSAL | Status: DC | PRN

## 2018-08-26 MED ORDER — FENTANYL CITRATE (PF) 100 MCG/2ML IJ SOLN: 25.0000 ug | INTRAMUSCULAR | Status: DC | PRN

## 2018-08-26 MED ORDER — ONDANSETRON HCL 4 MG PO TABS: 4.0000 mg | ORAL_TABLET | Freq: Four times a day (QID) | ORAL | Status: DC | PRN

## 2018-08-26 MED ORDER — ASPIRIN EC 81 MG PO TBEC
81.0000 mg | DELAYED_RELEASE_TABLET | Freq: Two times a day (BID) | ORAL | Status: DC
  Administered 2018-08-26 – 2018-08-27 (×2): 81 mg via ORAL
  Filled 2018-08-26 (×2): qty 1

## 2018-08-26 MED ORDER — PHENOL 1.4 % MT LIQD: 1.0000 | OROMUCOSAL | Status: DC | PRN

## 2018-08-26 MED ORDER — FENTANYL CITRATE (PF) 250 MCG/5ML IJ SOLN
INTRAMUSCULAR | Status: AC
  Filled 2018-08-26: qty 5

## 2018-08-26 MED ORDER — ACETAMINOPHEN 325 MG PO TABS: 325.0000 mg | ORAL_TABLET | Freq: Four times a day (QID) | ORAL | Status: DC | PRN

## 2018-08-26 MED ORDER — PROPOFOL 10 MG/ML IV BOLUS
INTRAVENOUS | Status: AC
  Filled 2018-08-26: qty 20

## 2018-08-26 MED ORDER — SODIUM CHLORIDE 0.9 % IR SOLN
Status: DC | PRN
  Administered 2018-08-26: 3000 mL

## 2018-08-26 MED ORDER — SODIUM CHLORIDE 0.9 % IV SOLN
INTRAVENOUS | Status: DC
  Administered 2018-08-26: 23:00:00 via INTRAVENOUS

## 2018-08-26 MED ORDER — LIDOCAINE 2% (20 MG/ML) 5 ML SYRINGE
INTRAMUSCULAR | Status: DC | PRN
  Administered 2018-08-26: 60 mg via INTRAVENOUS

## 2018-08-26 MED ORDER — ZOLPIDEM TARTRATE 5 MG PO TABS: 5.0000 mg | ORAL_TABLET | Freq: Every evening | ORAL | Status: DC | PRN

## 2018-08-26 MED ORDER — PROMETHAZINE HCL 25 MG/ML IJ SOLN: 6.2500 mg | INTRAMUSCULAR | Status: DC | PRN

## 2018-08-26 MED ORDER — SUGAMMADEX SODIUM 200 MG/2ML IV SOLN
INTRAVENOUS | Status: DC | PRN
  Administered 2018-08-26: 200 mg via INTRAVENOUS

## 2018-08-26 MED ORDER — LOSARTAN POTASSIUM-HCTZ 100-12.5 MG PO TABS: 1.0000 | ORAL_TABLET | Freq: Every day | ORAL | Status: DC

## 2018-08-26 MED ORDER — ACETAMINOPHEN 500 MG PO TABS
500.0000 mg | ORAL_TABLET | Freq: Four times a day (QID) | ORAL | Status: DC
  Administered 2018-08-26 – 2018-08-27 (×3): 500 mg via ORAL
  Filled 2018-08-26 (×3): qty 1

## 2018-08-26 MED ORDER — GLYCOPYRROLATE PF 0.2 MG/ML IJ SOSY
PREFILLED_SYRINGE | INTRAMUSCULAR | Status: DC | PRN
  Administered 2018-08-26 (×2): .1 mg via INTRAVENOUS

## 2018-08-26 MED ORDER — SENNOSIDES-DOCUSATE SODIUM 8.6-50 MG PO TABS: 1.0000 | ORAL_TABLET | Freq: Every evening | ORAL | Status: DC | PRN

## 2018-08-26 MED ORDER — BISACODYL 5 MG PO TBEC: 5.0000 mg | DELAYED_RELEASE_TABLET | Freq: Every day | ORAL | Status: DC | PRN

## 2018-08-26 MED ORDER — CEFAZOLIN SODIUM-DEXTROSE 2-4 GM/100ML-% IV SOLN
2.0000 g | INTRAVENOUS | Status: AC
  Administered 2018-08-26: 2 g via INTRAVENOUS
  Filled 2018-08-26: qty 100

## 2018-08-26 MED ORDER — BUPIVACAINE HCL (PF) 0.5 % IJ SOLN
INTRAMUSCULAR | Status: DC | PRN
  Administered 2018-08-26: 14 mL via PERINEURAL

## 2018-08-26 MED ORDER — POVIDONE-IODINE 7.5 % EX SOLN
Freq: Once | CUTANEOUS | Status: DC
  Filled 2018-08-26: qty 118

## 2018-08-26 MED ORDER — MIDAZOLAM HCL 2 MG/2ML IJ SOLN
2.0000 mg | Freq: Once | INTRAMUSCULAR | Status: AC
  Administered 2018-08-26: 1 mg via INTRAVENOUS
  Filled 2018-08-26: qty 2

## 2018-08-26 MED ORDER — EPHEDRINE 5 MG/ML INJ
INTRAVENOUS | Status: AC
  Filled 2018-08-26: qty 10

## 2018-08-26 MED ORDER — ONDANSETRON HCL 4 MG/2ML IJ SOLN
INTRAMUSCULAR | Status: DC | PRN
  Administered 2018-08-26: 4 mg via INTRAVENOUS

## 2018-08-26 MED ORDER — ROCURONIUM BROMIDE 10 MG/ML (PF) SYRINGE
PREFILLED_SYRINGE | INTRAVENOUS | Status: DC | PRN
  Administered 2018-08-26: 20 mg via INTRAVENOUS
  Administered 2018-08-26: 50 mg via INTRAVENOUS

## 2018-08-26 MED ORDER — 0.9 % SODIUM CHLORIDE (POUR BTL) OPTIME
TOPICAL | Status: DC | PRN
  Administered 2018-08-26: 1000 mL

## 2018-08-26 MED ORDER — ROCURONIUM BROMIDE 50 MG/5ML IV SOSY
PREFILLED_SYRINGE | INTRAVENOUS | Status: AC
  Filled 2018-08-26: qty 5

## 2018-08-26 MED ORDER — HYDROCODONE-ACETAMINOPHEN 7.5-325 MG PO TABS
1.0000 | ORAL_TABLET | ORAL | Status: DC | PRN
  Filled 2018-08-26: qty 2

## 2018-08-26 MED ORDER — LACTATED RINGERS IV SOLN
INTRAVENOUS | Status: DC
  Administered 2018-08-26: 09:00:00 via INTRAVENOUS

## 2018-08-26 MED ORDER — DOCUSATE SODIUM 100 MG PO CAPS
100.0000 mg | ORAL_CAPSULE | Freq: Two times a day (BID) | ORAL | Status: DC
  Administered 2018-08-26 – 2018-08-27 (×2): 100 mg via ORAL
  Filled 2018-08-26 (×3): qty 1

## 2018-08-26 MED ORDER — CEFAZOLIN SODIUM-DEXTROSE 2-4 GM/100ML-% IV SOLN
2.0000 g | Freq: Four times a day (QID) | INTRAVENOUS | Status: AC
  Administered 2018-08-26 – 2018-08-27 (×3): 2 g via INTRAVENOUS
  Filled 2018-08-26 (×3): qty 100

## 2018-08-26 MED ORDER — EPHEDRINE SULFATE-NACL 50-0.9 MG/10ML-% IV SOSY
PREFILLED_SYRINGE | INTRAVENOUS | Status: DC | PRN
  Administered 2018-08-26: 5 mg via INTRAVENOUS

## 2018-08-26 MED ORDER — METOCLOPRAMIDE HCL 5 MG PO TABS: 5.0000 mg | ORAL_TABLET | Freq: Three times a day (TID) | ORAL | Status: DC | PRN

## 2018-08-26 MED ORDER — FENTANYL CITRATE (PF) 100 MCG/2ML IJ SOLN
100.0000 ug | Freq: Once | INTRAMUSCULAR | Status: AC
  Administered 2018-08-26: 50 ug via INTRAVENOUS
  Filled 2018-08-26: qty 2

## 2018-08-26 MED ORDER — PROPOFOL 10 MG/ML IV BOLUS
INTRAVENOUS | Status: DC | PRN
  Administered 2018-08-26: 100 mg via INTRAVENOUS
  Administered 2018-08-26: 20 mg via INTRAVENOUS

## 2018-08-26 MED ORDER — BUPIVACAINE LIPOSOME 1.3 % IJ SUSP
INTRAMUSCULAR | Status: DC | PRN
  Administered 2018-08-26: 10 mL via PERINEURAL

## 2018-08-26 MED ORDER — ALUMINUM HYDROXIDE GEL 320 MG/5ML PO SUSP
15.0000 mL | ORAL | Status: DC | PRN
  Filled 2018-08-26: qty 30

## 2018-08-26 MED ORDER — GLYCOPYRROLATE PF 0.2 MG/ML IJ SOSY
PREFILLED_SYRINGE | INTRAMUSCULAR | Status: AC
  Filled 2018-08-26: qty 1

## 2018-08-26 MED ORDER — ATORVASTATIN CALCIUM 40 MG PO TABS
40.0000 mg | ORAL_TABLET | Freq: Every day | ORAL | Status: DC
  Administered 2018-08-26 – 2018-08-27 (×2): 40 mg via ORAL
  Filled 2018-08-26 (×2): qty 1

## 2018-08-26 MED ORDER — ONDANSETRON HCL 4 MG/2ML IJ SOLN: 4.0000 mg | Freq: Four times a day (QID) | INTRAMUSCULAR | Status: DC | PRN

## 2018-08-26 SURGICAL SUPPLY — 75 items
BIT DRILL 5/64X5 DISP (BIT) ×3 IMPLANT
BLADE SAW SAG 73X25 THK (BLADE) ×2
BLADE SAW SGTL 73X25 THK (BLADE) ×1 IMPLANT
BLADE SURG 15 STRL LF DISP TIS (BLADE) ×1 IMPLANT
BLADE SURG 15 STRL SS (BLADE) ×2
CEMENT BONE DEPUY (Cement) ×3 IMPLANT
CHLORAPREP W/TINT 26ML (MISCELLANEOUS) ×3 IMPLANT
CLOSURE WOUND 1/2 X4 (GAUZE/BANDAGES/DRESSINGS) ×1
COMP GLENOID FIXATION M35 (Shoulder) ×3 IMPLANT
COMPONENT GLENOID FIXATION M35 (Shoulder) ×1 IMPLANT
COVER SURGICAL LIGHT HANDLE (MISCELLANEOUS) ×3 IMPLANT
COVER WAND RF STERILE (DRAPES) ×3 IMPLANT
DRAPE HALF SHEET 40X57 (DRAPES) ×3 IMPLANT
DRAPE INCISE IOBAN 66X45 STRL (DRAPES) ×3 IMPLANT
DRAPE ORTHO SPLIT 77X108 STRL (DRAPES) ×4
DRAPE SURG 17X23 STRL (DRAPES) ×3 IMPLANT
DRAPE SURG ORHT 6 SPLT 77X108 (DRAPES) ×2 IMPLANT
DRAPE U-SHAPE 47X51 STRL (DRAPES) ×3 IMPLANT
DRSG AQUACEL AG ADV 3.5X 6 (GAUZE/BANDAGES/DRESSINGS) ×3 IMPLANT
DRSG AQUACEL AG ADV 3.5X10 (GAUZE/BANDAGES/DRESSINGS) IMPLANT
ELECT BLADE 4.0 EZ CLEAN MEGAD (MISCELLANEOUS)
ELECT REM PT RETURN 9FT ADLT (ELECTROSURGICAL) ×3
ELECTRODE BLDE 4.0 EZ CLN MEGD (MISCELLANEOUS) IMPLANT
ELECTRODE REM PT RTRN 9FT ADLT (ELECTROSURGICAL) ×1 IMPLANT
GLOVE BIO SURGEON STRL SZ7 (GLOVE) ×3 IMPLANT
GLOVE BIO SURGEON STRL SZ7.5 (GLOVE) ×3 IMPLANT
GLOVE BIOGEL PI IND STRL 7.0 (GLOVE) ×1 IMPLANT
GLOVE BIOGEL PI IND STRL 8 (GLOVE) ×1 IMPLANT
GLOVE BIOGEL PI INDICATOR 7.0 (GLOVE) ×2
GLOVE BIOGEL PI INDICATOR 8 (GLOVE) ×2
GOWN STRL REUS W/ TWL LRG LVL3 (GOWN DISPOSABLE) ×1 IMPLANT
GOWN STRL REUS W/ TWL XL LVL3 (GOWN DISPOSABLE) ×1 IMPLANT
GOWN STRL REUS W/TWL LRG LVL3 (GOWN DISPOSABLE) ×2
GOWN STRL REUS W/TWL XL LVL3 (GOWN DISPOSABLE) ×2
GUIDEWIRE GLENOID 2.5X220 (WIRE) ×3 IMPLANT
HANDPIECE INTERPULSE COAX TIP (DISPOSABLE) ×2
HEAD HUMERAL AEQUALIS 48X18 (Head) ×3 IMPLANT
HEAD HUMERAL HIGH OS 48X18 (Head) ×1 IMPLANT
HEMOSTAT SURGICEL 2X14 (HEMOSTASIS) ×6 IMPLANT
HOOD PEEL AWAY FLYTE STAYCOOL (MISCELLANEOUS) ×6 IMPLANT
HUMERAL STEM AEQUALIS 3BX74MM (Stem) ×3 IMPLANT
KIT BASIN OR (CUSTOM PROCEDURE TRAY) ×3 IMPLANT
KIT TURNOVER KIT B (KITS) ×3 IMPLANT
MANIFOLD NEPTUNE II (INSTRUMENTS) ×3 IMPLANT
NEEDLE MAYO TROCAR (NEEDLE) ×3 IMPLANT
NS IRRIG 1000ML POUR BTL (IV SOLUTION) ×3 IMPLANT
PACK SHOULDER (CUSTOM PROCEDURE TRAY) ×3 IMPLANT
PAD ARMBOARD 7.5X6 YLW CONV (MISCELLANEOUS) ×6 IMPLANT
RESTRAINT HEAD UNIVERSAL NS (MISCELLANEOUS) ×3 IMPLANT
RETRIEVER SUT HEWSON (MISCELLANEOUS) ×3 IMPLANT
SET HNDPC FAN SPRY TIP SCT (DISPOSABLE) ×1 IMPLANT
SLING ARM FOAM STRAP LRG (SOFTGOODS) ×3 IMPLANT
SLING ARM FOAM STRAP MED (SOFTGOODS) IMPLANT
SLING ARM IMMOBILIZER LRG (SOFTGOODS) ×3 IMPLANT
SMARTMIX MINI TOWER (MISCELLANEOUS) ×3
SPONGE LAP 18X18 X RAY DECT (DISPOSABLE) ×3 IMPLANT
SPONGE LAP 4X18 RFD (DISPOSABLE) IMPLANT
STEM HUMERAL AEQUALIS 3BX74MM (Stem) ×1 IMPLANT
STRIP CLOSURE SKIN 1/2X4 (GAUZE/BANDAGES/DRESSINGS) ×2 IMPLANT
SUCTION FRAZIER HANDLE 10FR (MISCELLANEOUS) ×2
SUCTION TUBE FRAZIER 10FR DISP (MISCELLANEOUS) ×1 IMPLANT
SUPPORT WRAP ARM LG (MISCELLANEOUS) ×3 IMPLANT
SUT ETHIBOND NAB CT1 #1 30IN (SUTURE) ×9 IMPLANT
SUT FIBERWIRE #2 38 T-5 BLUE (SUTURE)
SUT MNCRL AB 4-0 PS2 18 (SUTURE) ×3 IMPLANT
SUT VIC AB 0 CT1 27 (SUTURE) ×2
SUT VIC AB 0 CT1 27XBRD ANBCTR (SUTURE) ×1 IMPLANT
SUT VIC AB 2-0 CT1 27 (SUTURE) ×2
SUT VIC AB 2-0 CT1 TAPERPNT 27 (SUTURE) ×1 IMPLANT
SUTURE FIBERWR #2 38 T-5 BLUE (SUTURE) IMPLANT
TAPE LABRALWHITE 1.5X36 (TAPE) ×3 IMPLANT
TAPE STRIPS DRAPE STRL (GAUZE/BANDAGES/DRESSINGS) ×3 IMPLANT
TAPE SUT LABRALTAP WHT/BLK (SUTURE) ×3 IMPLANT
TOWEL OR 17X26 10 PK STRL BLUE (TOWEL DISPOSABLE) ×3 IMPLANT
TOWER SMARTMIX MINI (MISCELLANEOUS) ×1 IMPLANT

## 2018-08-26 NOTE — Anesthesia Procedure Notes (Signed)
Anesthesia Procedure Image    

## 2018-08-26 NOTE — Op Note (Signed)
Procedure(s): RIGHT TOTAL SHOULDER ARTHROPLASTY Procedure Note  Melissa Lane female 84 y.o. 08/26/2018  Procedure(s) and Anesthesia Type:    * RIGHT TOTAL SHOULDER ARTHROPLASTY - Choice  Surgeon(s) and Role:    Tania Ade, MD - Primary   Indications:  84 y.o. female  With endstage right shoulder arthritis. Pain and dysfunction interfered with quality of life and nonoperative treatment with activity modification, NSAIDS and injections failed.     Surgeon: Isabella Stalling   Assistants: Jeanmarie Hubert PA-C [facility] was present and scrubbed throughout the procedure and was essential in positioning, retraction, exposure, and closure)  Anesthesia: General endotracheal anesthesia with preoperative interscalene block given by the attending anesthesiologist    Procedure Detail  RIGHT TOTAL SHOULDER ARTHROPLASTY  Findings: Tornier flex anatomic press-fit size 3 stem with a 48 head, cemented size 35 medium Cortiloc glenoid.   A lesser tuberosity osteotomy was performed and repaired at the conclusion of the procedure.  Estimated Blood Loss:  200 mL         Drains: None   Blood Given: none          Specimens: none        Complications:  * No complications entered in OR log *         Disposition: PACU - hemodynamically stable.         Condition: stable    Procedure:   The patient was identified in the preoperative holding area where I personally marked the operative extremity after verifying with the patient and consent. She  was taken to the operating room where She was transferred to the   operative table.  The patient received an interscalene block in   the holding area by the attending anesthesiologist.  General anesthesia was induced   in the operating room without complication.  The patient did receive IV  Ancef prior to the commencement of the procedure.  The patient was   placed in the beach-chair position with the back raised about 30   degrees.  The  nonoperative extremity and head and neck were carefully   positioned and padded protecting against neurovascular compromise.  The   left upper extremity was then prepped and draped in the standard sterile   fashion.    The appropriate operative time-out was performed with   Anesthesia, the perioperative staff, as well as myself and we all agreed   that the right side was the correct operative site.  The patient received 1 g IV tranexamic acid at the start of the case around time of the incision. An approximately  10 cm incision was made from the tip of the coracoid to the center point of the   humerus at the level of the axilla.  Dissection was carried down sharply   through subcutaneous tissues and cephalic vein was identified and taken   laterally with the deltoid.  The pectoralis major was taken medially.  The   upper 1 cm of the pectoralis major was released from its attachment on   the humerus.  The clavipectoral fascia was incised just lateral to the   conjoined tendon.  This incision was carried up to but not into the   coracoacromial ligament.  Digital palpation was used to prove   integrity of the axillary nerve which was protected throughout the   procedure.  Musculocutaneous nerve was not palpated in the operative   field.  Conjoined tendon was then retracted gently medially and the   deltoid  laterally.  Anterior circumflex humeral vessels were clamped and   coagulated.  The soft tissues overlying the biceps was incised and this   incision was carried across the transverse humeral ligament to the base   of the coracoid.  The biceps was noted to be severely degenerated. It was released from the superior labrum and the remaining portion of the biceps superiorly was   excised.  An osteotomy was performed at the lesser tuberosity.  The capsule was then   released all the way down to the 6 o'clock position of the humeral head.   The humeral head was then delivered with simultaneous  adduction,   extension and external rotation.  All humeral osteophytes were removed   and the anatomic neck of the humerus was marked and cut free hand at   approximately 25 degrees retroversion within about 3 mm of the cuff   reflection posteriorly.  The head size was estimated to be a 48 medium   offset.  At that point, the humeral head was retracted posteriorly with   a Fukuda retractor.   Remaining portion of the capsule was released at the base of the   coracoid.  The remaining biceps anchor and the entire anterior-inferior   labrum was excised.  The posterior labrum was also excised but the   posterior capsule was not released.  The guidepin was placed bicortically with 5 deg elevated guide.  The reamer was used to ream to concentric bone with punctate bleeding.  This gave an excellent concentric surface.  The center hole was then drilled for an anchor peg glenoid followed by the three peripheral holes and none of the holes   exited the glenoid wall.  I then pulse irrigated these holes and dried   them with Surgicel.  The three peripheral holes were then   pressurized cemented and the anchor peg glenoid was placed and impacted   with an excellent fit.  The glenoid was a 35 medium component.  The proximal humerus was then again exposed taking care not to displace the glenoid.    The entry awl was used followed by sounding reamers and then sequentially broached from size 1-3. This was then left in place and the calcar planer was used. Trial head was placed with a 48.  With the trial implantation of the component,  there was approximately 50% posterior translation with immediate snap back to the   anatomic position.  With forward elevation, there was no tendency   towards posterior subluxation.   The trial was removed and the final implant was prepared on a back table.  The trial was removed and the final implant was prepared on a back table.   3 small holes were drilled on the medial side of  the lesser tuberosity osteotomy, through which 2 labral tapes were passed. The implant was then placed through the loop of the 2 labral tapes and impacted with an excellent press-fit. This achieved excellent anatomic reconstruction of the proximal humerus.  The joint was then copiously irrigated with pulse lavage.  The subscapularis and   lesser tuberosity osteotomy were then repaired using the 2 labral tapes previously passed in a double row fashion with horizontal mattress sutures medially brought over through bone tunnels tied over a bone bridge laterally.   One #1 Ethibond was placed at the rotator interval just above   the lesser tuberosity. Copious irrigation was used. Skin was closed with 2-0 Vicryl sutures in the deep dermal layer and  4-0 Monocryl in a subcuticular  running fashion.  Sterile dressings were then applied including Aquacel.  The patient was placed in a sling and allowed to awaken from general anesthesia and taken to the recovery room in stable condition.      POSTOPERATIVE PLAN:  Early passive range of motion will be allowed with the goal of 0 degrees external rotation and 90 degrees forward elevation.  No internal rotation at this time.  No active motion of the arm until the lesser tuberosity heals.  The patient will likely be kept in the hospital for 1-2 days and then discharged home.

## 2018-08-26 NOTE — Progress Notes (Signed)
Assessment completed by Lennice Sites, RN

## 2018-08-26 NOTE — Anesthesia Procedure Notes (Signed)
Anesthesia Regional Block: Interscalene brachial plexus block   Pre-Anesthetic Checklist: ,, timeout performed, Correct Patient, Correct Site, Correct Laterality, Correct Procedure, Correct Position, site marked, Risks and benefits discussed,  Surgical consent,  Pre-op evaluation,  At surgeon's request and post-op pain management  Laterality: Right  Prep: chloraprep       Needles:  Injection technique: Single-shot  Needle Type: Echogenic Needle     Needle Length: 9cm      Additional Needles:   Procedures:,,,, ultrasound used (permanent image in chart),,,,  Narrative:  Start time: 08/26/2018 9:10 AM End time: 08/26/2018 9:20 AM Injection made incrementally with aspirations every 5 mL.  Performed by: Personally  Anesthesiologist: Myrtie Soman, MD  Additional Notes: Patient tolerated the procedure well without complications

## 2018-08-26 NOTE — Anesthesia Procedure Notes (Signed)
Procedure Name: Intubation Date/Time: 08/26/2018 9:49 AM Performed by: Colin Benton, CRNA Pre-anesthesia Checklist: Patient identified, Emergency Drugs available, Suction available and Patient being monitored Patient Re-evaluated:Patient Re-evaluated prior to induction Oxygen Delivery Method: Circle system utilized Preoxygenation: Pre-oxygenation with 100% oxygen Induction Type: IV induction Ventilation: Mask ventilation without difficulty Laryngoscope Size: Miller and 2 Grade View: Grade I Tube type: Oral Tube size: 7.0 mm Number of attempts: 1 Airway Equipment and Method: Stylet Placement Confirmation: ETT inserted through vocal cords under direct vision,  positive ETCO2 and breath sounds checked- equal and bilateral Secured at: 22 cm Tube secured with: Tape Dental Injury: Teeth and Oropharynx as per pre-operative assessment

## 2018-08-26 NOTE — H&P (Signed)
Melissa Lane is an 84 y.o. female.   Chief Complaint: Right shoulder pain and dysfunction HPI: Endstage R shoulder arthritis with significant pain and dysfunction, failed conservative measures.  Pain interferes with sleep and quality of life.   Past Medical History:  Diagnosis Date  . Arthritis   . Coronary artery calcification seen on CAT scan 2016  . Hyperlipidemia   . Hypertension   . Peri-rectal abscess 2013  . SVT (supraventricular tachycardia) ([place]) 05/2018    Past Surgical History:  Procedure Laterality Date  . ABDOMINAL HYSTERECTOMY  1978   Partial  . BREAST BIOPSY Left 07/26/1999  . CATARACT EXTRACTION      Family History  Problem Relation Age of Onset  . Alzheimer's disease Mother        died at 32  . Breast cancer Mother           . Heart attack Father 40       died at 61  . Crohn's disease Son    Social History:  reports that she has never smoked. She has never used smokeless tobacco. She reports that she drinks about 1.0 - 2.0 standard drinks of alcohol per week. She reports that she does not use drugs.  Allergies:  Allergies  Allergen Reactions  . Macrodantin [Nitrofurantoin Macrocrystal] Other (See Comments)    UNSPECIFIED REACTION   . Naproxen Other (See Comments)    UNSPECIFIED REACTION   . Vitamin D Other (See Comments)    UNSPECIFIED REACTION  Not otc but high dose of rx    Medications Prior to Admission  Medication Sig Dispense Refill  . aspirin EC 81 MG tablet Take 81 mg by mouth daily.    Marland Kitchen atorvastatin (LIPITOR) 40 MG tablet Take 40 mg by mouth daily.    . celecoxib (CELEBREX) 200 MG capsule Take 200 mg by mouth 2 (two) times daily.     Marland Kitchen losartan-hydrochlorothiazide (HYZAAR) 100-12.5 MG per tablet Take 1 tablet by mouth daily.    . metoprolol succinate (TOPROL XL) 25 MG 24 hr tablet Take 1 tablet (25 mg total) by mouth daily. 90 tablet 3  . Multiple Vitamin (MULTIVITAMIN) capsule Take 1 capsule by mouth daily.      Results for orders  placed or performed during the hospital encounter of 08/24/18 (from the past 48 hour(s))  Urinalysis, Routine w reflex microscopic     Status: Abnormal   Collection Time: 08/24/18  1:41 PM  Result Value Ref Range   Color, Urine YELLOW YELLOW   APPearance HAZY (A) CLEAR   Specific Gravity, Urine 1.021 1.005 - 1.030   pH 5.0 5.0 - 8.0   Glucose, UA NEGATIVE NEGATIVE mg/dL   Hgb urine dipstick NEGATIVE NEGATIVE   Bilirubin Urine NEGATIVE NEGATIVE   Ketones, ur NEGATIVE NEGATIVE mg/dL   Protein, ur NEGATIVE NEGATIVE mg/dL   Nitrite NEGATIVE NEGATIVE   Leukocytes, UA MODERATE (A) NEGATIVE   RBC / HPF 0-5 0 - 5 RBC/hpf   WBC, UA 0-5 0 - 5 WBC/hpf   Bacteria, UA RARE (A) NONE SEEN   Squamous Epithelial / LPF 0-5 0 - 5   Mucus PRESENT    Hyaline Casts, UA PRESENT     Comment: Performed at [facility], [address], [place], [place] [address]  APTT     Status: None   Collection Time: 08/24/18  1:42 PM  Result Value Ref Range   aPTT 25 24 - 36 seconds    Comment: Performed  at [facility], [place] [address], [place], [place] [address]  CBC WITH DIFFERENTIAL     Status: None   Collection Time: 08/24/18  1:42 PM  Result Value Ref Range   WBC 9.8 4.0 - 10.5 K/uL   RBC 4.62 3.87 - 5.11 MIL/uL   Hemoglobin 14.1 12.0 - 15.0 g/dL   HCT 45.8 36.0 - 46.0 %   MCV 99.1 80.0 - 100.0 fL   MCH 30.5 26.0 - 34.0 pg   MCHC 30.8 30.0 - 36.0 g/dL   RDW 12.8 11.5 - 15.5 %   Platelets 254 150 - 400 K/uL   nRBC 0.0 0.0 - 0.2 %   Neutrophils Relative % 63 %   Neutro Abs 6.1 1.7 - 7.7 K/uL   Lymphocytes Relative 31 %   Lymphs Abs 3.0 0.7 - 4.0 K/uL   Monocytes Relative 5 %   Monocytes Absolute 0.5 0.1 - 1.0 K/uL   Eosinophils Relative 1 %   Eosinophils Absolute 0.1 0.0 - 0.5 K/uL   Basophils Relative 0 %   Basophils Absolute 0.0 0.0 - 0.1 K/uL   Immature Granulocytes 0 %   Abs Immature Granulocytes 0.02 0.00 - 0.07 K/uL    Comment: Performed at [facility], [address]  [place], [place] [address]  Comprehensive metabolic panel     Status: Abnormal   Collection Time: 08/24/18  1:42 PM  Result Value Ref Range   Sodium 141 135 - 145 mmol/L   Potassium 3.4 (L) 3.5 - 5.1 mmol/L   Chloride 107 98 - 111 mmol/L   CO2 23 22 - 32 mmol/L   Glucose, Bld 124 (H) 70 - 99 mg/dL   BUN 24 (H) 8 - 23 mg/dL   Creatinine, Ser 1.17 (H) 0.44 - 1.00 mg/dL   Calcium 9.5 8.9 - 10.3 mg/dL   Total Protein 7.1 6.5 - 8.1 g/dL   Albumin 4.3 3.5 - 5.0 g/dL   AST 25 15 - 41 U/L   ALT 19 0 - 44 U/L   Alkaline Phosphatase 94 38 - 126 U/L   Total Bilirubin 0.8 0.3 - 1.2 mg/dL   GFR calc non Af Amer 43 (L) >60 mL/min   GFR calc Af Amer 50 (L) >60 mL/min   Anion gap 11 5 - 15    Comment: Performed at [facility] [address] [place], [place] [address]  Protime-INR     Status: None   Collection Time: 08/24/18  1:42 PM  Result Value Ref Range   Prothrombin Time 13.4 11.4 - 15.2 seconds   INR 1.03     Comment: Performed at [facility] [address] [place], [place] [address]  Surgical pcr screen     Status: Abnormal   Collection Time: 08/24/18  1:42 PM  Result Value Ref Range   MRSA, PCR NEGATIVE NEGATIVE   Staphylococcus aureus POSITIVE (A) NEGATIVE    Comment: (NOTE) The Xpert SA Assay (FDA approved for NASAL specimens in patients 37 years of age and older), is one component of a comprehensive surveillance program. It is not intended to diagnose infection nor to guide or monitor treatment. Performed at [facility], [place] [address], [place], [place] [address]   Type and screen Order type and screen if day of surgery is less than 15 days from draw of preadmission visit or order morning of surgery if day of surgery is greater than 6 days from preadmission visit.  Status: None   Collection Time: 08/24/18  2:02 PM  Result Value Ref Range   ABO/RH(D) B POS    Antibody Screen NEG    Sample Expiration 09/07/2018    Extend sample reason      NO TRANSFUSIONS  OR PREGNANCY IN THE PAST 3 MONTHS Performed at [facility], [place] [address], [place], [place] [address]   ABO/Rh     Status: None   Collection Time: 08/24/18  2:02 PM  Result Value Ref Range   ABO/RH(D)      B POS Performed at [facility] [address], [place], [place] [address]    Dg Chest 2 View  Result Date: 08/24/2018 CLINICAL DATA:  84 year old female. Preoperative exam. No chest complaints. Initial encounter. EXAM: CHEST - 2 VIEW COMPARISON:  09/10/2012 chest x-ray. FINDINGS: Mild progression of chronic lung changes. No segmental consolidation, pulmonary edema, pneumothorax or plain film evidence of pulmonary malignancy. Heart size top-normal.  Calcified slightly tortuous aorta. Right shoulder joint degenerative changes. IMPRESSION: 1. Mild progression of chronic lung changes. 2. No infiltrate or congestive heart failure. 3.  Aortic Atherosclerosis (ICD10-I70.0). Electronically Signed   By: Genia Del M.D.   On: 08/24/2018 21:45    Review of Systems  All other systems reviewed and are negative.   Blood pressure (!) 124/56, pulse (!) 55, temperature 98 F (36.7 C), temperature source Oral, resp. rate 18, height 5\' 6"  (1.676 m), weight 102.3 kg, SpO2 97 %. Physical Exam  Constitutional: She is oriented to person, place, and time. She appears well-developed and well-nourished.  HENT:  Head: Atraumatic.  Eyes: EOM are normal.  Cardiovascular: Intact distal pulses.  Respiratory: Effort normal.  Musculoskeletal:  Right shoulder pain with limited range of motion  Neurological: She is alert and oriented to person, place, and time.  Skin: Skin is warm and dry.  Psychiatric: She has a normal mood and affect.     Assessment/Plan Right shoulder end-stage osteoarthritis, failed conservative management. Plan right total shoulder arthroplasty Risks / benefits of surgery discussed Consent on chart  NPO for OR Preop antibiotics   Isabella Stalling, MD 08/26/2018,  9:02 AM

## 2018-08-26 NOTE — Transfer of Care (Signed)
Immediate Anesthesia Transfer of Care Note  Patient: Melissa Lane  Procedure(s) Performed: RIGHT TOTAL SHOULDER ARTHROPLASTY (Right )  Patient Location: PACU  Anesthesia Type:GA combined with regional for post-op pain  Level of Consciousness: awake, alert , oriented and patient cooperative  Airway & Oxygen Therapy: Patient Spontanous Breathing and Patient connected to nasal cannula oxygen  Post-op Assessment: Report given to RN and Post -op Vital signs reviewed and stable  Post vital signs: Reviewed and stable  Last Vitals:  Vitals Value Taken Time  BP 117/51 08/26/2018 11:37 AM  Temp    Pulse 56 08/26/2018 11:38 AM  Resp 17 08/26/2018 11:38 AM  SpO2 100 % 08/26/2018 11:38 AM  Vitals shown include unvalidated device data.  Last Pain:  Vitals:   08/26/18 0803  TempSrc: Oral  PainSc: 0-No pain      Patients Stated Pain Goal: 3 (58/30/94 0768)  Complications: No apparent anesthesia complications

## 2018-08-26 NOTE — Anesthesia Postprocedure Evaluation (Signed)
Anesthesia Post Note  Patient: Melissa Lane  Procedure(s) Performed: RIGHT TOTAL SHOULDER ARTHROPLASTY (Right )     Patient location during evaluation: PACU Anesthesia Type: General Level of consciousness: awake and alert Pain management: pain level controlled Vital Signs Assessment: post-procedure vital signs reviewed and stable Respiratory status: spontaneous breathing, nonlabored ventilation, respiratory function stable and patient connected to nasal cannula oxygen Cardiovascular status: blood pressure returned to baseline and stable Postop Assessment: no apparent nausea or vomiting Anesthetic complications: no    Last Vitals:  Vitals:   08/26/18 1152 08/26/18 1200  BP: 127/64 (!) 118/50  Pulse: (!) 55 (!) 51  Resp: 20 17  Temp:    SpO2: 100% 100%    Last Pain:  Vitals:   08/26/18 1138  TempSrc:   PainSc: 0-No pain                 Hollin Crewe S

## 2018-08-26 NOTE — Discharge Instructions (Signed)

## 2018-08-27 ENCOUNTER — Encounter (HOSPITAL_COMMUNITY): Payer: Self-pay | Admitting: Orthopedic Surgery

## 2018-08-27 LAB — BASIC METABOLIC PANEL
Anion gap: 11 (ref 5–15)
BUN: 19 mg/dL (ref 8–23)
CO2: 22 mmol/L (ref 22–32)
CREATININE: 1.18 mg/dL — AB (ref 0.44–1.00)
Calcium: 8.5 mg/dL — ABNORMAL LOW (ref 8.9–10.3)
Chloride: 105 mmol/L (ref 98–111)
GFR calc Af Amer: 50 mL/min — ABNORMAL LOW (ref >60)
GFR calc non Af Amer: 43 mL/min — ABNORMAL LOW (ref >60)
Glucose, Bld: 153 mg/dL — ABNORMAL HIGH (ref 70–99)
Potassium: 3.9 mmol/L (ref 3.5–5.1)
Sodium: 138 mmol/L (ref 135–145)

## 2018-08-27 LAB — CBC
HCT: 36.6 % (ref 36.0–46.0)
Hemoglobin: 11.9 g/dL — ABNORMAL LOW (ref 12.0–15.0)
MCH: 31 pg (ref 26.0–34.0)
MCHC: 32.5 g/dL (ref 30.0–36.0)
MCV: 95.3 fL (ref 80.0–100.0)
Platelets: 195 10*3/uL (ref 150–400)
RBC: 3.84 MIL/uL — ABNORMAL LOW (ref 3.87–5.11)
RDW: 12.9 % (ref 11.5–15.5)
WBC: 15.2 10*3/uL — ABNORMAL HIGH (ref 4.0–10.5)
nRBC: 0 % (ref 0.0–0.2)

## 2018-08-27 MED ORDER — HYDROCODONE-ACETAMINOPHEN 5-325 MG PO TABS: 1.0000 | ORAL_TABLET | ORAL | 0 refills | Status: DC | PRN

## 2018-08-27 NOTE — Progress Notes (Signed)
   PATIENT ID: Melissa Lane   1 Day Post-Op Procedure(s) (LRB): RIGHT TOTAL SHOULDER ARTHROPLASTY (Right)  Subjective: Doing great. No Pain. No complaints. Slept well overnight. Hopes to go home today.   Objective:  Vitals:   08/27/18 0219 08/27/18 0615  BP: (!) 107/47 112/61  Pulse: (!) 51 (!) 55  Resp: 18 14  Temp: 98.4 F (36.9 C) 97.9 F (36.6 C)  SpO2: 94% 96%     R UE dressing c/d/i Wiggles fingers, block still working  Labs:  Recent Labs    08/24/18 1342 08/27/18 0231  HGB 14.1 11.9*   Recent Labs    08/24/18 1342 08/27/18 0231  WBC 9.8 15.2*  RBC 4.62 3.84*  HCT 45.8 36.6  PLT 254 195   Recent Labs    08/24/18 1342 08/27/18 0231  NA 141 138  K 3.4* 3.9  CL 107 105  CO2 23 22  BUN 24* 19  CREATININE 1.17* 1.18*  GLUCOSE 124* 153*  CALCIUM 9.5 8.5*    Assessment and Plan: 1 day s/p right TSA OT- PROM goal to 90RR, 0 ER D/c home today when cleared by OT, follow up in 2 weeks meds escribed  VTE proph: asa, scds

## 2018-08-27 NOTE — Plan of Care (Signed)
  Problem: Education: Goal: Knowledge of General Education information will improve Description Including pain rating scale, medication(s)/side effects and non-pharmacologic comfort measures Outcome: Progressing   Problem: Health Behavior/Discharge Planning: Goal: Ability to manage health-related needs will improve Outcome: Progressing   Problem: Clinical Measurements: Goal: Ability to maintain clinical measurements within normal limits will improve Outcome: Progressing Goal: Will remain free from infection Outcome: Progressing   Problem: Activity: Goal: Risk for activity intolerance will decrease Outcome: Progressing   Problem: Nutrition: Goal: Adequate nutrition will be maintained Outcome: Progressing   Problem: Coping: Goal: Level of anxiety will decrease Outcome: Progressing   Problem: Elimination: Goal: Will not experience complications related to bowel motility Outcome: Progressing Goal: Will not experience complications related to urinary retention Outcome: Progressing   Problem: Pain Managment: Goal: General experience of comfort will improve Outcome: Progressing   Problem: Safety: Goal: Ability to remain free from injury will improve Outcome: Progressing   Problem: Skin Integrity: Goal: Risk for impaired skin integrity will decrease Outcome: Progressing   Problem: Education: Goal: Knowledge of the prescribed therapeutic regimen will improve Outcome: Progressing Goal: Understanding of activity limitations/precautions following surgery will improve Outcome: Progressing   Problem: Pain Management: Goal: Pain level will decrease with appropriate interventions Outcome: Progressing

## 2018-08-27 NOTE — Progress Notes (Signed)
Melissa Lane to be D/C'd  per MD order. Discussed with the patient and all questions fully answered.  VSS, Skin clean, dry and intact without evidence of skin break down, no evidence of skin tears noted.  IV catheter discontinued intact. Site without signs and symptoms of complications. Dressing and pressure applied.  An After Visit Summary was printed and given to the patient. Patient received prescription.  D/c education completed with patient/family including follow up instructions, medication list, d/c activities limitations if indicated, with other d/c instructions as indicated by MD - patient able to verbalize understanding, all questions fully answered.   Patient instructed to return to ED, call 911, or call MD for any changes in condition.   Patient to be escorted via [place], and D/C home via private auto.

## 2018-08-27 NOTE — Evaluation (Signed)
Occupational Therapy Evaluation Patient Details Name: Melissa Lane MRN: [id number] DOB: [birth year] Today's Date: 08/27/2018    History of Present Illness Pt is an 84 y/o female now s/p R TSA. PMHx includes HTN, SVT   Clinical Impression   This 84 y/o female presents with the above. At baseline pt is independent with ADLs and functional mobility. Pt lives with spouse who reports is able to assist with ADLs, iADLs PRN after discharge home. Pt's spouse and daughter present and engaged during session as well. Pt completing room level mobility without AD and overall minguard-supervision asisst. She currently requires modA for UB and LB ADLs given current RUE limitations. Educated both pt/pt's family on shoulder precautions, HEP, sling management, safety and compensatory strategies for performing ADLs and functional transfers while maintaining precautions with both parties verbalizing understanding and report feeling comfortable completing functional tasks. Questions answered throughout session with no further acute OT needs identified at this time. Feel pt is safe to return home given available family assist and follow up as recommended per MD. Acute OT to sign off at this time. Thank you for this referral.     Follow Up Recommendations  Follow surgeon's recommendation for DC plan and follow-up therapies;Supervision/Assistance - 24 hour    Equipment Recommendations  None recommended by OT           Precautions / Restrictions Precautions Precautions: Shoulder;Fall Type of Shoulder Precautions: sling at all times except ADL/exercise; NWB operative UE; AROM E/W/H to tolerance, NO shoulder AROM,  PROM within limits of FF 0-90, ER to neutral, no pendulums Shoulder Interventions: Shoulder sling/immobilizer;At all times;Off for dressing/bathing/exercises Precaution Booklet Issued: Yes (comment) Precaution Comments: issued and reviewed with pt/pt's family Required Braces or Orthoses:  Sling Restrictions Weight Bearing Restrictions: Yes RUE Weight Bearing: Non weight bearing      Mobility Bed Mobility               General bed mobility comments: OOB in recliner upon arrival; educated on exiting bed to the L to eliminate need for WB through RUE  Transfers Overall transfer level: Needs assistance Equipment used: None Transfers: Sit to/from Stand Sit to Stand: Supervision         General transfer comment: for general safety, no physical assist required     Balance Overall balance assessment: Needs assistance Sitting-balance support: Feet supported Sitting balance-Leahy Scale: Good     Standing balance support: No upper extremity supported;During functional activity Standing balance-Leahy Scale: Fair                             ADL either performed or assessed with clinical judgement   ADL Overall ADL's : Needs assistance/impaired Eating/Feeding: Set up;Sitting   Grooming: Set up;Sitting   Upper Body Bathing: Min guard;Minimal assistance;Sitting   Lower Body Bathing: Min guard;Minimal assistance;Sit to/from stand   Upper Body Dressing : Moderate assistance;Sitting;With caregiver independent assisting   Lower Body Dressing: Minimal assistance;With caregiver independent assisting;Sit to/from stand   Toilet Transfer: Min guard;Ambulation;Regular Toilet   Toileting- Water quality scientist and Hygiene: Min guard;Minimal assistance;Sit to/from stand;With caregiver independent assisting Toileting - Clothing Manipulation Details (indicate cue type and reason): spouse reports assisted with underwear today     Tub/Shower Transfer Details (indicate cue type and reason): discussed use of DME during bathing for increased safety during task completion; family reports will purchase DME PRN, pt declining need for 3:1 in shower; advised pt's spouse be present during bathing  initially for increased safety with pt/pt's spouse verbalizing  understanding Functional mobility during ADLs: Min guard General ADL Comments: educated both pt/pt's family on shoulder precautions, HEP, safety and compensatory strategies for performing ADLs and functional transfers while maintaining precautions with both parties verbalizing understanding     Vision         Perception     Praxis      Pertinent Vitals/Pain Pain Assessment: No/denies pain(still feeling nerve block)     Hand Dominance Right   Extremity/Trunk Assessment Upper Extremity Assessment Upper Extremity Assessment: RUE deficits/detail RUE Deficits / Details: s/p reverse TSA, still feeling nerve block RUE: Unable to fully assess due to immobilization RUE Sensation: decreased light touch RUE Coordination: decreased fine motor;decreased gross motor   Lower Extremity Assessment Lower Extremity Assessment: Overall WFL for tasks assessed       Communication Communication Communication: HOH(reads lips well)   Cognition Arousal/Alertness: Awake/alert Behavior During Therapy: WFL for tasks assessed/performed Overall Cognitive Status: Within Functional Limits for tasks assessed                                     General Comments  pt's family present throughout session    Exercises Exercises: Shoulder;Hand exercises Shoulder Exercises Elbow Flexion: AAROM;PROM;Right;Seated(limited number of each exercise due to nerve block ) Elbow Extension: PROM;AAROM;Right;Seated(limited number of each exercise due to nerve block ) Wrist Flexion: AAROM;Right;Seated Wrist Extension: AAROM;Right;Seated Digit Composite Flexion: AROM;Right;10 reps;Seated Composite Extension: AROM;10 reps;Right;Seated Neck Flexion: AROM;Seated Neck Extension: AROM;Seated Neck Lateral Flexion - Right: AROM;Seated Neck Lateral Flexion - Left: AROM;Seated Hand Exercises Forearm Supination: AAROM;AROM;5 reps;Right;Seated Forearm Pronation: AROM;AAROM;5 reps;Right;Seated   Shoulder  Instructions Shoulder Instructions Donning/doffing shirt without moving shoulder: Moderate assistance;Caregiver independent with task;Patient able to independently direct caregiver Method for sponge bathing under operated UE: Minimal assistance;Caregiver independent with task;Patient able to independently direct caregiver Donning/doffing sling/immobilizer: Moderate assistance;Caregiver independent with task;Patient able to independently direct caregiver Correct positioning of sling/immobilizer: Minimal assistance;Caregiver independent with task ROM for elbow, wrist and digits of operated UE: Min-guard Sling wearing schedule (on at all times/off for ADL's): Modified independent Proper positioning of operated UE when showering: Supervision/safety Positioning of UE while sleeping: Minimal assistance    Home Living Family/patient expects to be discharged to:: Private residence Living Arrangements: Spouse/significant other Available Help at Discharge: Family;Available 24 hours/day Type of Home: House Home Access: Stairs to enter [employer] of Steps: 1   Home Layout: One level     Bathroom Shower/Tub: Occupational psychologist: Handicapped height     Home Equipment: None          Prior Functioning/Environment Level of Independence: Independent                 OT Problem List: Decreased strength;Decreased range of motion;Impaired UE functional use;Decreased knowledge of precautions;Decreased activity tolerance;Impaired sensation      OT Treatment/Interventions:      OT Goals(Current goals can be found in the care plan section) Acute Rehab OT Goals Patient Stated Goal: home today OT Goal Formulation: All assessment and education complete, DC therapy  OT Frequency:     Barriers to D/C:            Co-evaluation              AM-PAC OT "6 Clicks" Daily Activity     Outcome Measure Help from another person eating meals?: None Help  from another  person taking care of personal grooming?: None Help from another person toileting, which includes using toliet, bedpan, or urinal?: A Little Help from another person bathing (including washing, rinsing, drying)?: A Little Help from another person to put on and taking off regular upper body clothing?: A Lot Help from another person to put on and taking off regular lower body clothing?: A Lot 6 Click Score: 18   End of Session Equipment Utilized During Treatment: (sling) Nurse Communication: Mobility status  Activity Tolerance: Patient tolerated treatment well Patient left: in chair;with call bell/phone within reach;with family/visitor present  OT Visit Diagnosis: Other (comment);Other abnormalities of gait and mobility (R26.89)(s/p R TSA)                Time: 7253-6644 OT Time Calculation (min): 38 min Charges:  OT General Charges $OT Visit: 1 Visit OT Evaluation $OT Eval Low Complexity: 1 Low OT Treatments $Self Care/Home Management : 8-22 mins  Lou Cal, OT [facility] Pager [phone] Office [phone]   Raymondo Band 08/27/2018, 12:10 PM

## 2018-08-27 NOTE — Discharge Summary (Signed)
Patient ID: Melissa Lane MRN: [id number] DOB/AGE: 1934-02-05 84 y.o.  Admit date: 08/26/2018 Discharge date: 08/27/2018  Admission Diagnoses:  Active Problems:   S/P shoulder replacement, right   Discharge Diagnoses:  Same  Past Medical History:  Diagnosis Date  . Arthritis   . Coronary artery calcification seen on CAT scan 2016  . Hyperlipidemia   . Hypertension   . Peri-rectal abscess 2013  . SVT (supraventricular tachycardia) ([place]) 05/2018    Surgeries: Procedure(s): RIGHT TOTAL SHOULDER ARTHROPLASTY on 08/26/2018   Consultants:   Discharged Condition: Improved  Hospital Course: Melissa Lane is an 84 y.o. female who was admitted 08/26/2018 for operative treatment of right shoulder OA. Patient has severe unremitting pain that affects sleep, daily activities, and work/hobbies. After pre-op clearance the patient was taken to the operating room on 08/26/2018 and underwent  Procedure(s): RIGHT TOTAL SHOULDER ARTHROPLASTY.    Patient was given perioperative antibiotics:  Anti-infectives (From admission, onward)   Start     Dose/Rate Route Frequency Ordered Stop   08/26/18 1600  ceFAZolin (ANCEF) IVPB 2g/100 mL premix     2 g 200 mL/hr over 30 Minutes Intravenous Every 6 hours 08/26/18 1257 08/27/18 0456   08/26/18 0715  ceFAZolin (ANCEF) IVPB 2g/100 mL premix     2 g 200 mL/hr over 30 Minutes Intravenous On call to O.R. 08/26/18 2671 08/26/18 0953       Patient was given sequential compression devices, early ambulation, and chemoprophylaxis to prevent DVT.  Patient benefited maximally from hospital stay and there were no complications.    Recent vital signs:  Patient Vitals for the past 24 hrs:  BP Temp Temp src Pulse Resp SpO2 Height Weight  08/27/18 0615 112/61 97.9 F (36.6 C) Oral (!) 55 14 96 % - -  08/27/18 0219 (!) 107/47 98.4 F (36.9 C) Oral (!) 51 18 94 % - -  08/26/18 2002 (!) 126/42 98.2 F (36.8 C) Axillary (!) 58 16 95 % - -  08/26/18 1301 - - - - - -  5\' 6"  (1.676 m) 103.5 kg  08/26/18 1254 (!) 114/53 - - (!) 48 16 96 % - -  08/26/18 1200 (!) 118/50 97.7 F (36.5 C) - (!) 51 17 100 % - -  08/26/18 1152 127/64 - - (!) 55 20 100 % - -  08/26/18 1138 (!) 117/51 97.7 F (36.5 C) - - - - - -     Recent laboratory studies:  Recent Labs    08/24/18 1342 08/27/18 0231  WBC 9.8 15.2*  HGB 14.1 11.9*  HCT 45.8 36.6  PLT 254 195  NA 141 138  K 3.4* 3.9  CL 107 105  CO2 23 22  BUN 24* 19  CREATININE 1.17* 1.18*  GLUCOSE 124* 153*  INR 1.03  --   CALCIUM 9.5 8.5*     Discharge Medications:   Allergies as of 08/27/2018      Reactions   Macrodantin [nitrofurantoin Macrocrystal] Other (See Comments)   UNSPECIFIED REACTION    Naproxen Other (See Comments)   UNSPECIFIED REACTION    Vitamin D Other (See Comments)   UNSPECIFIED REACTION  Not otc but high dose of rx      Medication List    STOP taking these medications   celecoxib 200 MG capsule Commonly known as:  CELEBREX     TAKE these medications   aspirin EC 81 MG tablet Take 81 mg by mouth daily.   atorvastatin 40 MG tablet  Commonly known as:  LIPITOR Take 40 mg by mouth daily.   HYDROcodone-acetaminophen 5-325 MG tablet Commonly known as:  NORCO/VICODIN Take 1-2 tablets by mouth every 4 (four) hours as needed for moderate pain (pain score 4-6).   losartan-hydrochlorothiazide 100-12.5 MG tablet Commonly known as:  HYZAAR Take 1 tablet by mouth daily.   metoprolol succinate 25 MG 24 hr tablet Commonly known as:  TOPROL-XL Take 1 tablet (25 mg total) by mouth daily.   multivitamin capsule Take 1 capsule by mouth daily.       Diagnostic Studies: Dg Chest 2 View  Result Date: 08/24/2018 CLINICAL DATA:  84 year old female. Preoperative exam. No chest complaints. Initial encounter. EXAM: CHEST - 2 VIEW COMPARISON:  09/10/2012 chest x-ray. FINDINGS: Mild progression of chronic lung changes. No segmental consolidation, pulmonary edema, pneumothorax or plain  film evidence of pulmonary malignancy. Heart size top-normal.  Calcified slightly tortuous aorta. Right shoulder joint degenerative changes. IMPRESSION: 1. Mild progression of chronic lung changes. 2. No infiltrate or congestive heart failure. 3.  Aortic Atherosclerosis (ICD10-I70.0). Electronically Signed   By: Genia Del M.D.   On: 08/24/2018 21:45   Ct Shoulder Right Wo Contrast  Result Date: 08/18/2018 CLINICAL DATA:  Chronic right shoulder pain. Patient for shoulder replacement. Preoperative planning exam. EXAM: CT OF THE UPPER RIGHT EXTREMITY WITHOUT CONTRAST TECHNIQUE: Multidetector CT imaging of the upper right extremity was performed according to the standard protocol. COMPARISON:  None. FINDINGS: Bones/Joint/Cartilage The patient has advanced glenohumeral osteoarthritis. There is bone-on-bone joint space narrowing, subchondral sclerosis in the glenoid and a large osteophyte off the medial margin of the humeral head. No notable subchondral cyst formation. A 1.3 cm in diameter loose body is seen in the anterior aspect of the joint and there is a 0.4 cm loose body in the posterior joint. The patient has bulky acromioclavicular osteoarthritis. The acromion is type 2. There is no fracture. The posterior arc of the right first rib is mildly hypoplastic. No worrisome lesion is identified. Ligaments Suboptimally assessed by CT. Muscles and Tendons The rotator cuff appears intact. No atrophy of musculature about the shoulder is identified. Soft tissues No evidence of bursitis.  Imaged lung parenchyma is clear. IMPRESSION: Advanced glenohumeral osteoarthritis as described. Two loose bodies are seen in the joint. Bulky acromioclavicular osteoarthritis. Electronically Signed   By: Inge Rise M.D.   On: 08/18/2018 09:42   Dg Shoulder Right Port  Result Date: 08/26/2018 CLINICAL DATA:  Status post right shoulder replacement. EXAM: PORTABLE RIGHT SHOULDER COMPARISON:  Shoulder CT 08/17/2018 FINDINGS:  Post right shoulder hemiarthroplasty with normal alignment at the glenohumeral joint. Expected postsurgical changes. No evidence of fracture. IMPRESSION: Post right shoulder hemiarthroplasty without evidence of immediate complications. Electronically Signed   By: Fidela Salisbury M.D.   On: 08/26/2018 13:44    Disposition: Discharge disposition: 01-Home or Self Care       Discharge Instructions    Call MD / Call 911   Complete by:  As directed    If you experience chest pain or shortness of breath, CALL 911 and be transported to the hospital emergency room.  If you develope a fever above 101 F, pus (white drainage) or increased drainage or redness at the wound, or calf pain, call your surgeon's office.   Constipation Prevention   Complete by:  As directed    Drink plenty of fluids.  Prune juice may be helpful.  You may use a stool softener, such as Colace (over the counter) 100 mg  twice a day.  Use MiraLax (over the counter) for constipation as needed.   Diet - low sodium heart healthy   Complete by:  As directed    Increase activity slowly as tolerated   Complete by:  As directed       Follow-up Information    Tania Ade, MD. Schedule an appointment as soon as possible for a visit in 2 weeks.   Specialty:  Orthopedic Surgery Contact information: [address] [place] [address] [phone]            Signed: Grier Mitts 08/27/2018, 8:23 AM

## 2018-09-03 ENCOUNTER — Encounter (HOSPITAL_COMMUNITY): Payer: Self-pay | Admitting: *Deleted

## 2018-09-03 ENCOUNTER — Other Ambulatory Visit: Payer: Self-pay

## 2018-09-03 ENCOUNTER — Ambulatory Visit (HOSPITAL_COMMUNITY)
Admission: EM | Admit: 2018-09-03 | Discharge: 2018-09-03 | Disposition: A | Discharge status: Home / Self Care | Payer: Medicare Other | Attending: Emergency Medicine | Admitting: Emergency Medicine

## 2018-09-03 DIAGNOSIS — H60542 Acute eczematoid otitis externa, left ear: Secondary | ICD-10-CM | POA: Diagnosis not present

## 2018-09-03 DIAGNOSIS — G500 Trigeminal neuralgia: Secondary | ICD-10-CM | POA: Diagnosis not present

## 2018-09-03 MED ORDER — HYDROCORTISONE 2.5 % EX LOTN: TOPICAL_LOTION | Freq: Two times a day (BID) | CUTANEOUS | 0 refills | Status: DC

## 2018-09-03 MED ORDER — NEOMYCIN-POLYMYXIN-HC 3.5-10000-1 OT SUSP: 4.0000 [drp] | Freq: Three times a day (TID) | OTIC | 0 refills | Status: DC

## 2018-09-03 NOTE — ED Provider Notes (Signed)
[facility]    CSN: [id number] Arrival date & time: 09/03/18  5329     History   Chief Complaint Chief Complaint  Patient presents with  . Otalgia    HPI Melissa Lane is a 84 y.o. female.   Recent shoulder replacement surgery.  The history is provided by the patient.  Otalgia  Location:  Left Behind ear:  No abnormality Quality:  Sharp Severity:  Mild Onset quality:  Sudden Timing:  Constant Progression:  Worsening Chronicity:  New Context: not recent URI and not water in ear   Relieved by:  Nothing Worsened by:  Nothing Ineffective treatments:  None tried Associated symptoms: no abdominal pain, no congestion, no cough, no fever, no rash, no rhinorrhea, no sore throat and no vomiting  Headaches: intermittent stabbing, short bursts of pain in left temple.     Past Medical History:  Diagnosis Date  . Arthritis   . Coronary artery calcification seen on CAT scan 2016  . Hyperlipidemia   . Hypertension   . Peri-rectal abscess 2013  . SVT (supraventricular tachycardia) ([place]) 05/2018    Patient Active Problem List   Diagnosis Date Noted  . S/P shoulder replacement, right 08/26/2018  . Allergic rhinitis 06/03/2016  . Essential hypertension, benign 06/03/2016  . Impaired fasting glucose 06/03/2016  . Peripheral neuropathy 06/03/2016  . Elevated coronary artery calcium score 02/21/2016  . Bruit of right carotid artery 02/21/2016  . Pseudophakia of both eyes 06/01/2015  . 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 Dyslipidemia 05/09/2013  . 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 HTN (hypertension) 09/09/2012  . Mixed hyperlipidemia 09/09/2012  . Perianal abscess 07/26/2012  . Corneal scarring 01/12/2012  . Nuclear cataract 01/12/2012    Past Surgical History:  Procedure Laterality Date  . ABDOMINAL HYSTERECTOMY  1978   Partial  . BREAST BIOPSY Left 07/26/1999  . CATARACT EXTRACTION    . TOTAL SHOULDER ARTHROPLASTY Right 08/26/2018   Procedure: RIGHT TOTAL  SHOULDER ARTHROPLASTY;  Surgeon: Tania Ade, MD;  Location: [facility];  Service: Orthopedics;  Laterality: Right;    OB History   No obstetric history on file.      Home Medications    Prior to Admission medications   Medication Sig Start Date End Date Taking? Authorizing Provider  aspirin EC 81 MG tablet Take 81 mg by mouth daily.    [provider]  atorvastatin (LIPITOR) 40 MG tablet Take 40 mg by mouth daily.    [provider]  HYDROcodone-acetaminophen (NORCO/VICODIN) 5-325 MG tablet Take 1-2 tablets by mouth every 4 (four) hours as needed for moderate pain (pain score 4-6). 08/27/18   Grier Mitts, PA-C  hydrocortisone 2.5 % lotion Apply topically 2 (two) times daily. Left external ear 09/03/18   Katy Fitch, MD  losartan-hydrochlorothiazide [facility]) 100-12.5 MG per tablet Take 1 tablet by mouth daily.    [provider]  metoprolol succinate (TOPROL XL) 25 MG 24 hr tablet Take 1 tablet (25 mg total) by mouth daily. 07/05/18   Barrett, Evelene Croon, PA-C  Multiple Vitamin (MULTIVITAMIN) capsule Take 1 capsule by mouth daily.    [provider]  neomycin-polymyxin-hydrocortisone (CORTISPORIN) 3.5-10000-1 OTIC suspension Place 4 drops into the left ear 3 (three) times daily. 09/03/18   Katy Fitch, MD    Family History Family History  Problem Relation Age of Onset  . Alzheimer's disease Mother        died at 3  . Breast cancer Mother           .  Heart attack Father 47       died at 21  . Crohn's disease Son     Social History Social History   Tobacco Use  . Smoking status: Never Smoker  . Smokeless tobacco: Never Used  Substance Use Topics  . Alcohol use: Yes    Alcohol/week: 1.0 - 2.0 standard drinks    Types: 1 - 2 Standard drinks or equivalent per week    Comment: occasionally.   . Drug use: No    Frequency: 2.0 times per week     Allergies   Macrodantin [nitrofurantoin macrocrystal]; Naproxen; and Vitamin  d   Review of Systems Review of Systems  Constitutional: Negative for chills and fever.  HENT: Positive for ear pain. Negative for congestion, rhinorrhea and sore throat.   Eyes: Negative for pain and visual disturbance.  Respiratory: Negative for cough and shortness of breath.   Cardiovascular: Negative for chest pain and palpitations.  Gastrointestinal: Negative for abdominal pain and vomiting.  Genitourinary: Negative for dysuria and hematuria.  Musculoskeletal: Negative for arthralgias and back pain.  Skin: Negative for color change and rash.  Neurological: Negative for seizures and syncope. Headaches: intermittent stabbing, short bursts of pain in left temple.  All other systems reviewed and are negative.    Physical Exam Triage Vital Signs ED Triage Vitals  Enc Vitals Group     BP 09/03/18 0905 139/82     Pulse Rate 09/03/18 0905 69     Resp 09/03/18 0905 16     Temp 09/03/18 0905 97.9 F (36.6 C)     Temp Source 09/03/18 0905 Oral     SpO2 09/03/18 0905 92 %     Weight --      Height --      Head Circumference --      Peak Flow --      Pain Score 09/03/18 0908 7     Pain Loc --      Pain Edu? --      Excl. in [place]? --    No data found.  Updated Vital Signs BP 139/82 (BP Location: Left Arm)   Pulse 69   Temp 97.9 F (36.6 C) (Oral)   Resp 16   SpO2 92%   Visual Acuity Right Eye Distance:   Left Eye Distance:   Bilateral Distance:    Right Eye Near:   Left Eye Near:    Bilateral Near:     Physical Exam Constitutional:      Appearance: Normal appearance.  HENT:     Head: Normocephalic and atraumatic.     Comments: Dry, flaky skin in auricle of left ear and proximal canal. Mastoid normal. No temporal artery tenderness. No TMJ tenderness or crepitus.    Right Ear: Tympanic membrane, ear canal and external ear normal.     Left Ear: Tympanic membrane normal.     Nose: No congestion or rhinorrhea.     Mouth/Throat:     Mouth: Mucous membranes are  moist.     Pharynx: Oropharynx is clear.  Eyes:     Extraocular Movements: Extraocular movements intact.     Conjunctiva/sclera: Conjunctivae normal.  Neck:     Musculoskeletal: Normal range of motion and neck supple.  Pulmonary:     Effort: Pulmonary effort is normal. No respiratory distress.  Musculoskeletal: Normal range of motion.        General: No swelling or deformity.  Skin:    General: Skin is warm and dry.  Neurological:     General: No focal deficit present.     Mental Status: She is alert and oriented to person, place, and time.  Psychiatric:        Mood and Affect: Mood normal.        Behavior: Behavior normal.      UC Treatments / Results  Labs (all labs ordered are listed, but only abnormal results are displayed) Labs Reviewed - No data to display  EKG None  Radiology No results found.  Procedures Procedures (including critical care time)  Medications Ordered in UC Medications - No data to display  Initial Impression / Assessment and Plan / UC Course  I have reviewed the triage vital signs and the nursing notes.  Pertinent labs & imaging results that were available during my care of the patient were reviewed by me and considered in my medical decision making (see chart for details).     The patient appears to have eczematous otitis externa.  She wears hearing aids, and she may have some mild irritation.  She has used and requests hydrocortisone cream for her external ear.  Have prescribed drops in the canal itself.  Patient also might be suffering from trigeminal neuralgia.  She describes sharp bursts of pain in this region.  I considered temporal arteritis, TMJ dysfunction, stroke, intracerebral pathology.  Patient was given return precautions and counseled to follow-up if any neurologic symptoms develop. Final Clinical Impressions(s) / UC Diagnoses   Final diagnoses:  Acute eczematoid otitis externa of left ear  Trigeminal neuralgia of left side of  face   Discharge Instructions   None    ED Prescriptions    Medication Sig Dispense Auth. Provider   neomycin-polymyxin-hydrocortisone (CORTISPORIN) 3.5-10000-1 OTIC suspension Place 4 drops into the left ear 3 (three) times daily. 10 mL Katy Fitch, MD   hydrocortisone 2.5 % lotion Apply topically 2 (two) times daily. Left external ear 59 mL Katy Fitch, MD     Controlled Substance Prescriptions [place] Controlled Substance Registry consulted? No   Katy Fitch, MD 09/03/18 [phone]

## 2018-09-03 NOTE — ED Notes (Signed)
Patient able to ambulate independently  

## 2018-09-03 NOTE — ED Triage Notes (Signed)
C/o left earache onset 2 days ago.

## 2018-09-08 DIAGNOSIS — E785 Hyperlipidemia, unspecified: Secondary | ICD-10-CM | POA: Diagnosis not present

## 2018-09-08 DIAGNOSIS — R7301 Impaired fasting glucose: Secondary | ICD-10-CM | POA: Diagnosis not present

## 2018-09-08 DIAGNOSIS — M19011 Primary osteoarthritis, right shoulder: Secondary | ICD-10-CM | POA: Diagnosis not present

## 2018-09-08 DIAGNOSIS — I10 Essential (primary) hypertension: Secondary | ICD-10-CM | POA: Diagnosis not present

## 2018-10-04 DIAGNOSIS — M6281 Muscle weakness (generalized): Secondary | ICD-10-CM | POA: Diagnosis not present

## 2018-10-04 DIAGNOSIS — Z4789 Encounter for other orthopedic aftercare: Secondary | ICD-10-CM | POA: Diagnosis not present

## 2018-10-04 DIAGNOSIS — M25611 Stiffness of right shoulder, not elsewhere classified: Secondary | ICD-10-CM | POA: Diagnosis not present

## 2018-10-06 DIAGNOSIS — Z4789 Encounter for other orthopedic aftercare: Secondary | ICD-10-CM | POA: Diagnosis not present

## 2018-10-06 DIAGNOSIS — M25611 Stiffness of right shoulder, not elsewhere classified: Secondary | ICD-10-CM | POA: Diagnosis not present

## 2018-10-06 DIAGNOSIS — M6281 Muscle weakness (generalized): Secondary | ICD-10-CM | POA: Diagnosis not present

## 2018-10-11 DIAGNOSIS — M6281 Muscle weakness (generalized): Secondary | ICD-10-CM | POA: Diagnosis not present

## 2018-10-11 DIAGNOSIS — Z4789 Encounter for other orthopedic aftercare: Secondary | ICD-10-CM | POA: Diagnosis not present

## 2018-10-11 DIAGNOSIS — M25611 Stiffness of right shoulder, not elsewhere classified: Secondary | ICD-10-CM | POA: Diagnosis not present

## 2018-10-13 DIAGNOSIS — Z4789 Encounter for other orthopedic aftercare: Secondary | ICD-10-CM | POA: Diagnosis not present

## 2018-10-13 DIAGNOSIS — M25611 Stiffness of right shoulder, not elsewhere classified: Secondary | ICD-10-CM | POA: Diagnosis not present

## 2018-10-13 DIAGNOSIS — M6281 Muscle weakness (generalized): Secondary | ICD-10-CM | POA: Diagnosis not present

## 2018-10-18 DIAGNOSIS — M6281 Muscle weakness (generalized): Secondary | ICD-10-CM | POA: Diagnosis not present

## 2018-10-18 DIAGNOSIS — M25611 Stiffness of right shoulder, not elsewhere classified: Secondary | ICD-10-CM | POA: Diagnosis not present

## 2018-10-18 DIAGNOSIS — Z4789 Encounter for other orthopedic aftercare: Secondary | ICD-10-CM | POA: Diagnosis not present

## 2018-10-20 DIAGNOSIS — M6281 Muscle weakness (generalized): Secondary | ICD-10-CM | POA: Diagnosis not present

## 2018-10-20 DIAGNOSIS — M25611 Stiffness of right shoulder, not elsewhere classified: Secondary | ICD-10-CM | POA: Diagnosis not present

## 2018-10-20 DIAGNOSIS — Z4789 Encounter for other orthopedic aftercare: Secondary | ICD-10-CM | POA: Diagnosis not present

## 2018-10-25 DIAGNOSIS — M19011 Primary osteoarthritis, right shoulder: Secondary | ICD-10-CM | POA: Diagnosis not present

## 2018-10-25 DIAGNOSIS — Z471 Aftercare following joint replacement surgery: Secondary | ICD-10-CM | POA: Diagnosis not present

## 2018-10-25 DIAGNOSIS — Z96611 Presence of right artificial shoulder joint: Secondary | ICD-10-CM | POA: Diagnosis not present

## 2018-11-02 DIAGNOSIS — Z96611 Presence of right artificial shoulder joint: Secondary | ICD-10-CM | POA: Diagnosis not present

## 2018-11-05 DIAGNOSIS — Z96611 Presence of right artificial shoulder joint: Secondary | ICD-10-CM | POA: Diagnosis not present

## 2018-11-08 DIAGNOSIS — Z96611 Presence of right artificial shoulder joint: Secondary | ICD-10-CM | POA: Diagnosis not present

## 2018-11-10 DIAGNOSIS — Z96611 Presence of right artificial shoulder joint: Secondary | ICD-10-CM | POA: Diagnosis not present

## 2018-11-16 DIAGNOSIS — Z96611 Presence of right artificial shoulder joint: Secondary | ICD-10-CM | POA: Diagnosis not present

## 2018-11-18 DIAGNOSIS — Z96611 Presence of right artificial shoulder joint: Secondary | ICD-10-CM | POA: Diagnosis not present

## 2018-12-07 DIAGNOSIS — Z96611 Presence of right artificial shoulder joint: Secondary | ICD-10-CM | POA: Diagnosis not present

## 2018-12-10 DIAGNOSIS — Z96611 Presence of right artificial shoulder joint: Secondary | ICD-10-CM | POA: Diagnosis not present

## 2018-12-13 DIAGNOSIS — Z471 Aftercare following joint replacement surgery: Secondary | ICD-10-CM | POA: Diagnosis not present

## 2018-12-13 DIAGNOSIS — Z96611 Presence of right artificial shoulder joint: Secondary | ICD-10-CM | POA: Diagnosis not present

## 2018-12-16 DIAGNOSIS — Z96611 Presence of right artificial shoulder joint: Secondary | ICD-10-CM | POA: Diagnosis not present

## 2018-12-20 DIAGNOSIS — Z96611 Presence of right artificial shoulder joint: Secondary | ICD-10-CM | POA: Diagnosis not present

## 2019-03-21 DIAGNOSIS — H903 Sensorineural hearing loss, bilateral: Secondary | ICD-10-CM | POA: Diagnosis not present

## 2019-05-11 DIAGNOSIS — M1711 Unilateral primary osteoarthritis, right knee: Secondary | ICD-10-CM | POA: Diagnosis not present

## 2019-07-06 DIAGNOSIS — Z23 Encounter for immunization: Secondary | ICD-10-CM | POA: Diagnosis not present

## 2019-07-20 ENCOUNTER — Other Ambulatory Visit: Payer: Self-pay | Admitting: Physician Assistant

## 2019-08-21 ENCOUNTER — Other Ambulatory Visit: Payer: Self-pay | Admitting: Physician Assistant

## 2019-08-24 ENCOUNTER — Other Ambulatory Visit: Payer: Self-pay | Admitting: Cardiology

## 2019-08-24 MED ORDER — METOPROLOL SUCCINATE ER 25 MG PO TB24: 25.0000 mg | ORAL_TABLET | Freq: Every day | ORAL | 0 refills | Status: DC

## 2019-08-24 NOTE — Telephone Encounter (Signed)
°*  STAT* If patient is at the pharmacy, call can be transferred to refill team.   1. Which medications need to be refilled? (please list name of each medication and dose if known) metoprolol succinate (TOPROL-XL) 25 MG 24 hr tablet  2. Which pharmacy/location (including street and city if local pharmacy) is medication to be sent to? [place], [place]  3. Do they need a 30 day or 90 day supply? 90 day   Patient completely out of medication. She has an appointment 10/05/18.

## 2019-08-24 NOTE — Telephone Encounter (Signed)
Requested Prescriptions   Signed Prescriptions Disp Refills  . metoprolol succinate (TOPROL-XL) 25 MG 24 hr tablet 90 tablet 0    Sig: Take 1 tablet (25 mg total) by mouth daily. *PLEASE KEEP UPCOMING APPOINTMENT FOR FURTHER REFILLS.*    Authorizing Provider: Minus Breeding    Ordering User: Raelene Bott, Roman Dubuc L

## 2019-09-30 DIAGNOSIS — I10 Essential (primary) hypertension: Secondary | ICD-10-CM | POA: Diagnosis not present

## 2019-09-30 DIAGNOSIS — R7301 Impaired fasting glucose: Secondary | ICD-10-CM | POA: Diagnosis not present

## 2019-09-30 DIAGNOSIS — I517 Cardiomegaly: Secondary | ICD-10-CM | POA: Diagnosis not present

## 2019-09-30 DIAGNOSIS — E785 Hyperlipidemia, unspecified: Secondary | ICD-10-CM | POA: Diagnosis not present

## 2019-09-30 DIAGNOSIS — I2510 Atherosclerotic heart disease of native coronary artery without angina pectoris: Secondary | ICD-10-CM | POA: Diagnosis not present

## 2019-10-05 DIAGNOSIS — R002 Palpitations: Secondary | ICD-10-CM | POA: Insufficient documentation

## 2019-10-05 DIAGNOSIS — Z7189 Other specified counseling: Secondary | ICD-10-CM | POA: Insufficient documentation

## 2019-10-05 DIAGNOSIS — R072 Precordial pain: Secondary | ICD-10-CM | POA: Insufficient documentation

## 2019-10-05 NOTE — Progress Notes (Signed)
Cardiology Office Note   Date:  10/06/2019   ID:  Darby, Angelle 11/01/1933, MRN [id number]  PCP:  Marda Stalker, PA-C  Cardiologist:   Minus Breeding, MD   Chief Complaint  Patient presents with  . Palpitations      History of Present Illness: Melissa Lane is a 86 y.o. female who presents for follow up of coronary calcium.  Since I last saw her her husband died of pancreatic cancer last year.  She has had a stressful year because of this and all of the other things.  She denies any new cardiovascular symptoms however.  I treated her with beta-blockers for palpitations and she thinks her palpitations improved.  She has some rare nighttime skipping beats but no chest pressure, neck or arm discomfort.  She has no new shortness of breath, PND or orthopnea.    Past Medical History:  Diagnosis Date  . Arthritis   . Coronary artery calcification seen on CAT scan 2016  . Hyperlipidemia   . Hypertension   . Peri-rectal abscess 2013  . SVT (supraventricular tachycardia) ([place]) 05/2018    Past Surgical History:  Procedure Laterality Date  . ABDOMINAL HYSTERECTOMY  1978   Partial  . BREAST BIOPSY Left 07/26/1999  . CATARACT EXTRACTION    . TOTAL SHOULDER ARTHROPLASTY Right 08/26/2018   Procedure: RIGHT TOTAL SHOULDER ARTHROPLASTY;  Surgeon: Tania Ade, MD;  Location: [facility];  Service: Orthopedics;  Laterality: Right;     Current Outpatient Medications  Medication Sig Dispense Refill  . aspirin EC 81 MG tablet Take 81 mg by mouth daily.    Marland Kitchen atorvastatin (LIPITOR) 40 MG tablet Take 40 mg by mouth daily.    . hydrocortisone 2.5 % lotion Apply topically 2 (two) times daily. Left external ear 59 mL 0  . losartan-hydrochlorothiazide (HYZAAR) 100-12.5 MG per tablet Take 1 tablet by mouth daily.    . metoprolol succinate (TOPROL-XL) 25 MG 24 hr tablet Take 1 tablet (25 mg total) by mouth daily. *PLEASE KEEP UPCOMING APPOINTMENT FOR FURTHER REFILLS.* 90 tablet 0  .  Multiple Vitamin (MULTIVITAMIN) capsule Take 1 capsule by mouth daily.    Marland Kitchen HYDROcodone-acetaminophen (NORCO/VICODIN) 5-325 MG tablet Take 1-2 tablets by mouth every 4 (four) hours as needed for moderate pain (pain score 4-6). (Patient not taking: Reported on 10/06/2019) 30 tablet 0  . neomycin-polymyxin-hydrocortisone (CORTISPORIN) 3.5-10000-1 OTIC suspension Place 4 drops into the left ear 3 (three) times daily. (Patient not taking: Reported on 10/06/2019) 10 mL 0   No current facility-administered medications for this visit.    Allergies:   Macrodantin [nitrofurantoin macrocrystal], Naproxen, and Vitamin d   ROS:  Please see the history of present illness.   Otherwise, review of systems are positive for none.   All other systems are reviewed and negative.    PHYSICAL EXAM: VS:  BP (!) 145/74   Pulse (!) 57   Temp (!) 96.9 F (36.1 C)   Ht 5\' 6"  (1.676 m)   Wt 220 lb (99.8 kg)   SpO2 100%   BMI 35.51 kg/m  , BMI Body mass index is 35.51 kg/m. GENERAL:  Well appearing NECK:  No jugular venous distention, waveform within normal limits, carotid upstroke brisk and symmetric, no bruits, no thyromegaly LUNGS:  Clear to auscultation bilaterally CHEST:  Unremarkable HEART:  PMI not displaced or sustained,S1 and S2 within normal limits, no S3, no S4, no clicks, no rubs, no murmurs ABD:  Flat, positive bowel sounds normal  in frequency in pitch, no bruits, no rebound, no guarding, no midline pulsatile mass, no hepatomegaly, no splenomegaly EXT:  2 plus pulses throughout, no edema, no cyanosis no clubbing   EKG:  EKG is ordered today. The ekg ordered today demonstrates NSR, rate 57, axis within normal limits, intervals within normal limits, no acute ST-T wave changes.   Recent Labs: No results found for requested labs within last 8760 hours.    Lipid Panel No results found for: CHOL, TRIG, HDL, CHOLHDL, VLDL, LDLCALC, LDLDIRECT    Wt Readings from Last 3 Encounters:  10/06/19 220 lb  (99.8 kg)  08/26/18 228 lb 2.8 oz (103.5 kg)  08/24/18 225 lb 8 oz (102.3 kg)      Other studies Reviewed: Additional studies/ records that were reviewed today include: Labs. Review of the above records demonstrates:  Please see elsewhere in the note.     ASSESSMENT AND PLAN:  CHEST PAIN:   She is not having any chest discomfort.  She does have elevated coronary calcium but I don't think further testing is indicated.  PALPITATIONS:   These are well controlled with beta-blocker.  No change in therapy.  RISK REDUCTION: Her LDL was 73 and HDL 48 last week.  Triglycerides were elevated at 267 and we did talk about this and dietary changes.  COVID EDUCATION: She is anxious to get the vaccine we tried to look into helping her with this but there are no available spots.  Current medicines are reviewed at length with the patient today.  The patient does not have concerns regarding medicines.  The following changes have been made:  no change  Labs/ tests ordered today include: None  Orders Placed This Encounter  Procedures  . EKG 12-Lead     Disposition:   FU with me in one year.     Signed, Minus Breeding, MD  10/06/2019 11:17 AM    [facility]

## 2019-10-06 ENCOUNTER — Encounter (INDEPENDENT_AMBULATORY_CARE_PROVIDER_SITE_OTHER): Payer: Self-pay

## 2019-10-06 ENCOUNTER — Encounter: Payer: Self-pay | Admitting: Cardiology

## 2019-10-06 ENCOUNTER — Other Ambulatory Visit: Payer: Self-pay

## 2019-10-06 ENCOUNTER — Ambulatory Visit (INDEPENDENT_AMBULATORY_CARE_PROVIDER_SITE_OTHER): Payer: Medicare Other | Admitting: Cardiology

## 2019-10-06 VITALS — BP 145/74 | HR 57 | Temp 96.9°F | Ht 66.0 in | Wt 220.0 lb

## 2019-10-06 DIAGNOSIS — Z7189 Other specified counseling: Secondary | ICD-10-CM

## 2019-10-06 DIAGNOSIS — R002 Palpitations: Secondary | ICD-10-CM

## 2019-10-06 DIAGNOSIS — R072 Precordial pain: Secondary | ICD-10-CM | POA: Diagnosis not present

## 2019-10-06 NOTE — Patient Instructions (Signed)
Medication Instructions:  NO CHANGES *If you need a refill on your cardiac medications before your next appointment, please call your pharmacy*  Lab Work: NONE  Testing/Procedures: NONE  Follow-Up: At [facility], you and your health needs are our priority.  As part of our continuing mission to provide you with exceptional heart care, we have created designated Provider Care Teams.  These Care Teams include your primary Cardiologist (physician) and Advanced Practice Providers (APPs -  Physician Assistants and Nurse Practitioners) who all work together to provide you with the care you need, when you need it.  Your next appointment:   1 year(s)  You will receive a reminder letter in the mail two months in advance. If you don't receive a letter, please call our office to schedule the follow-up appointment.   The format for your next appointment:   In Person  Provider:   Minus Breeding, MD  Other Instructions CALL [phone] TO SCHEDULE [place]. CALL DAILY TO SEE IF ANY NEW SPOTS HAVE OPENED. THEY GIVE THE VACCINE AT:  [facility], [address] [place], [place] [address]   [facility], [address], [id number], [place], [place] [address]   [facility] [address], [place], [place] [address]

## 2019-10-25 DIAGNOSIS — L218 Other seborrheic dermatitis: Secondary | ICD-10-CM | POA: Diagnosis not present

## 2019-11-11 DIAGNOSIS — I517 Cardiomegaly: Secondary | ICD-10-CM | POA: Diagnosis not present

## 2019-11-11 DIAGNOSIS — E785 Hyperlipidemia, unspecified: Secondary | ICD-10-CM | POA: Diagnosis not present

## 2019-11-11 DIAGNOSIS — I10 Essential (primary) hypertension: Secondary | ICD-10-CM | POA: Diagnosis not present

## 2019-11-21 ENCOUNTER — Other Ambulatory Visit: Payer: Self-pay | Admitting: Cardiology

## 2020-02-17 DIAGNOSIS — H04123 Dry eye syndrome of bilateral lacrimal glands: Secondary | ICD-10-CM | POA: Diagnosis not present

## 2020-02-17 DIAGNOSIS — Z961 Presence of intraocular lens: Secondary | ICD-10-CM | POA: Diagnosis not present

## 2020-02-17 DIAGNOSIS — H5213 Myopia, bilateral: Secondary | ICD-10-CM | POA: Diagnosis not present

## 2020-02-17 DIAGNOSIS — H52203 Unspecified astigmatism, bilateral: Secondary | ICD-10-CM | POA: Diagnosis not present

## 2020-02-17 DIAGNOSIS — Z8669 Personal history of other diseases of the nervous system and sense organs: Secondary | ICD-10-CM | POA: Diagnosis not present

## 2020-02-24 ENCOUNTER — Telehealth: Payer: Self-pay

## 2020-02-24 DIAGNOSIS — K5900 Constipation, unspecified: Secondary | ICD-10-CM | POA: Diagnosis not present

## 2020-02-24 DIAGNOSIS — R1032 Left lower quadrant pain: Secondary | ICD-10-CM | POA: Diagnosis not present

## 2020-02-24 DIAGNOSIS — R194 Change in bowel habit: Secondary | ICD-10-CM | POA: Diagnosis not present

## 2020-02-24 NOTE — Telephone Encounter (Signed)
Opened in error

## 2020-02-27 DIAGNOSIS — R1032 Left lower quadrant pain: Secondary | ICD-10-CM | POA: Diagnosis not present

## 2020-02-27 DIAGNOSIS — K5900 Constipation, unspecified: Secondary | ICD-10-CM | POA: Diagnosis not present

## 2020-02-27 DIAGNOSIS — K635 Polyp of colon: Secondary | ICD-10-CM | POA: Diagnosis not present

## 2020-02-27 DIAGNOSIS — K5730 Diverticulosis of large intestine without perforation or abscess without bleeding: Secondary | ICD-10-CM | POA: Diagnosis not present

## 2020-02-27 DIAGNOSIS — R195 Other fecal abnormalities: Secondary | ICD-10-CM | POA: Diagnosis not present

## 2020-02-27 DIAGNOSIS — K621 Rectal polyp: Secondary | ICD-10-CM | POA: Diagnosis not present

## 2020-02-27 DIAGNOSIS — K620 Anal polyp: Secondary | ICD-10-CM | POA: Diagnosis not present

## 2020-02-27 DIAGNOSIS — D124 Benign neoplasm of descending colon: Secondary | ICD-10-CM | POA: Diagnosis not present

## 2020-02-27 LAB — HM COLONOSCOPY

## 2020-03-05 DIAGNOSIS — H179 Unspecified corneal scar and opacity: Secondary | ICD-10-CM | POA: Diagnosis not present

## 2020-05-03 DIAGNOSIS — L218 Other seborrheic dermatitis: Secondary | ICD-10-CM | POA: Diagnosis not present

## 2020-05-03 DIAGNOSIS — L821 Other seborrheic keratosis: Secondary | ICD-10-CM | POA: Diagnosis not present

## 2020-08-08 DIAGNOSIS — M19012 Primary osteoarthritis, left shoulder: Secondary | ICD-10-CM | POA: Diagnosis not present

## 2020-08-27 DIAGNOSIS — N390 Urinary tract infection, site not specified: Secondary | ICD-10-CM | POA: Diagnosis not present

## 2020-08-28 DIAGNOSIS — R220 Localized swelling, mass and lump, head: Secondary | ICD-10-CM | POA: Diagnosis not present

## 2020-08-28 DIAGNOSIS — T887XXA Unspecified adverse effect of drug or medicament, initial encounter: Secondary | ICD-10-CM | POA: Diagnosis not present

## 2020-08-28 DIAGNOSIS — R600 Localized edema: Secondary | ICD-10-CM | POA: Diagnosis not present

## 2020-08-28 DIAGNOSIS — N390 Urinary tract infection, site not specified: Secondary | ICD-10-CM | POA: Diagnosis not present

## 2020-10-08 ENCOUNTER — Ambulatory Visit: Payer: Medicare Other | Admitting: Cardiology

## 2020-10-11 DIAGNOSIS — N390 Urinary tract infection, site not specified: Secondary | ICD-10-CM | POA: Diagnosis not present

## 2020-10-22 DIAGNOSIS — Z87440 Personal history of urinary (tract) infections: Secondary | ICD-10-CM | POA: Diagnosis not present

## 2020-10-22 DIAGNOSIS — R8279 Other abnormal findings on microbiological examination of urine: Secondary | ICD-10-CM | POA: Diagnosis not present

## 2020-10-22 DIAGNOSIS — R3915 Urgency of urination: Secondary | ICD-10-CM | POA: Diagnosis not present

## 2020-11-07 NOTE — Progress Notes (Signed)
Cardiology Office Note   Date:  11/08/2020   ID:  Melissa, Lane 16-Sep-1934, MRN [id number]  PCP:  Marda Stalker, PA-C  Cardiologist:   Melissa Breeding, MD   Chief Complaint  Patient presents with  . Palpitations      History of Present Illness: Melissa Lane is a 87 y.o. female who presents for follow up of coronary calcium.  Since I last saw her her she has done well.  She is not exercising as much as I would like.  However, with her activities of living as she lives by herself now since her husband died of cancer over a year ago she is not having any chest pressure, neck or arm discomfort.  She had previously been having some nighttime palpitations but she is not having this.  She is not having any new shortness of breath, PND orthopnea.  She has had no presyncope or syncope.  She has had no weight gain or edema.  She has some conflict with her son who is  against vaccines and he apparently has some pneumonia and she has been worried about him.   Past Medical History:  Diagnosis Date  . Arthritis   . Coronary artery calcification seen on CAT scan 2016  . Hyperlipidemia   . Hypertension   . Peri-rectal abscess 2013  . SVT (supraventricular tachycardia) ([place]) 05/2018    Past Surgical History:  Procedure Laterality Date  . ABDOMINAL HYSTERECTOMY  1978   Partial  . BREAST BIOPSY Left 07/26/1999  . CATARACT EXTRACTION    . TOTAL SHOULDER ARTHROPLASTY Right 08/26/2018   Procedure: RIGHT TOTAL SHOULDER ARTHROPLASTY;  Surgeon: Tania Ade, MD;  Location: [facility];  Service: Orthopedics;  Laterality: Right;     Current Outpatient Medications  Medication Sig Dispense Refill  . aspirin EC 81 MG tablet Take 81 mg by mouth daily.    Marland Kitchen atorvastatin (LIPITOR) 40 MG tablet Take 40 mg by mouth daily.    . hydrocortisone 2.5 % lotion Apply topically 2 (two) times daily. Left external ear 59 mL 0  . losartan-hydrochlorothiazide (HYZAAR) 100-12.5 MG per tablet Take 1 tablet  by mouth daily.    . Multiple Vitamin (MULTIVITAMIN) capsule Take 1 capsule by mouth daily.    Marland Kitchen neomycin-polymyxin-hydrocortisone (CORTISPORIN) 3.5-10000-1 OTIC suspension Place 4 drops into the left ear 3 (three) times daily. 10 mL 0  . metoprolol succinate (TOPROL-XL) 25 MG 24 hr tablet Take 1 tablet (25 mg total) by mouth daily. 90 tablet 3   No current facility-administered medications for this visit.    Allergies:   Ciprofloxacin, Macrodantin [nitrofurantoin macrocrystal], Metronidazole, Naproxen, and Vitamin d   ROS:  Please see the history of present illness.   Otherwise, review of systems are positive for arthritis.   All other systems are reviewed and negative.    PHYSICAL EXAM: VS:  BP 116/70   Pulse (!) 53   Ht 5\' 6"  (1.676 m)   Wt 225 lb (102.1 kg)   SpO2 98%   BMI 36.32 kg/m  , BMI Body mass index is 36.32 kg/m. GENERAL:  Well appearing NECK:  No jugular venous distention, waveform within normal limits, carotid upstroke brisk and symmetric, no bruits, no thyromegaly LUNGS:  Clear to auscultation bilaterally CHEST:  Unremarkable HEART:  PMI not displaced or sustained,S1 and S2 within normal limits, no S3, no S4, no clicks, no rubs, no murmurs ABD:  Flat, positive bowel sounds normal in frequency in pitch, no bruits, no  rebound, no guarding, no midline pulsatile mass, no hepatomegaly, no splenomegaly EXT:  2 plus pulses throughout, no edema, no cyanosis no clubbing   EKG:  EKG is  ordered today. The ekg ordered today demonstrates NSR, rate 53, axis within normal limits, intervals within normal limits, no acute ST-T wave changes.   Recent Labs: No results found for requested labs within last 8760 hours.    Lipid Panel No results found for: CHOL, TRIG, HDL, CHOLHDL, VLDL, LDLCALC, LDLDIRECT    Wt Readings from Last 3 Encounters:  11/08/20 225 lb (102.1 kg)  10/06/19 220 lb (99.8 kg)  08/26/18 228 lb 2.8 oz (103.5 kg)      Other studies Reviewed: Additional  studies/ records that were reviewed today include: Labs Review of the above records demonstrates:  Please see elsewhere in the note.     ASSESSMENT AND PLAN:  ELEVATED CORONARY CALCIUM:   She has had no chest discomfort.  She is not exercising as much as I would like but we talked about this.  We will continue with primary risk reduction.  No further testing at this point.   PALPITATIONS: She is not noticing any palpitations which she previously was having at night.  She will continue on the low-dose beta-blocker.  RISK REDUCTION: Her LDL was 73 last year with an HDL of 48.  No change in therapy but I will repeat this today.    Current medicines are reviewed at length with the patient today.  The patient does not have concerns regarding medicines.  The following changes have been made:  None  Labs/ tests ordered today include:   Orders Placed This Encounter  Procedures  . Comprehensive metabolic panel  . CBC  . Lipid panel  . EKG 12-Lead     Disposition:   FU with me in 12 months.    Signed, Melissa Breeding, MD  11/08/2020 11:37 AM    [facility]

## 2020-11-08 ENCOUNTER — Ambulatory Visit (INDEPENDENT_AMBULATORY_CARE_PROVIDER_SITE_OTHER): Payer: Medicare Other | Admitting: Cardiology

## 2020-11-08 ENCOUNTER — Encounter: Payer: Self-pay | Admitting: Cardiology

## 2020-11-08 ENCOUNTER — Other Ambulatory Visit: Payer: Self-pay

## 2020-11-08 VITALS — BP 116/70 | HR 53 | Ht 66.0 in | Wt 225.0 lb

## 2020-11-08 DIAGNOSIS — R931 Abnormal findings on diagnostic imaging of heart and coronary circulation: Secondary | ICD-10-CM | POA: Diagnosis not present

## 2020-11-08 DIAGNOSIS — E785 Hyperlipidemia, unspecified: Secondary | ICD-10-CM

## 2020-11-08 DIAGNOSIS — R002 Palpitations: Secondary | ICD-10-CM

## 2020-11-08 DIAGNOSIS — I10 Essential (primary) hypertension: Secondary | ICD-10-CM | POA: Diagnosis not present

## 2020-11-08 LAB — COMPREHENSIVE METABOLIC PANEL
ALT: 17 IU/L (ref 0–32)
AST: 22 IU/L (ref 0–40)
Albumin/Globulin Ratio: 2.1 (ref 1.2–2.2)
Albumin: 4.5 g/dL (ref 3.6–4.6)
Alkaline Phosphatase: 130 IU/L — ABNORMAL HIGH (ref 44–121)
BUN/Creatinine Ratio: 19 (ref 12–28)
BUN: 16 mg/dL (ref 8–27)
Bilirubin Total: 0.6 mg/dL (ref 0.0–1.2)
CO2: 24 mmol/L (ref 20–29)
Calcium: 9.7 mg/dL (ref 8.7–10.3)
Chloride: 100 mmol/L (ref 96–106)
Creatinine, Ser: 0.86 mg/dL (ref 0.57–1.00)
GFR calc Af Amer: 72 mL/min/{1.73_m2} (ref >59)
GFR calc non Af Amer: 62 mL/min/{1.73_m2} (ref >59)
Globulin, Total: 2.1 g/dL (ref 1.5–4.5)
Glucose: 99 mg/dL (ref 65–99)
Potassium: 4.7 mmol/L (ref 3.5–5.2)
Sodium: 140 mmol/L (ref 134–144)
Total Protein: 6.6 g/dL (ref 6.0–8.5)

## 2020-11-08 LAB — LIPID PANEL
Chol/HDL Ratio: 3.4 ratio (ref 0.0–4.4)
Cholesterol, Total: 155 mg/dL (ref 100–199)
HDL: 46 mg/dL (ref >39)
LDL Chol Calc (NIH): 53 mg/dL (ref 0–99)
Triglycerides: 374 mg/dL — ABNORMAL HIGH (ref 0–149)
VLDL Cholesterol Cal: 56 mg/dL — ABNORMAL HIGH (ref 5–40)

## 2020-11-08 MED ORDER — METOPROLOL SUCCINATE ER 25 MG PO TB24: 25.0000 mg | ORAL_TABLET | Freq: Every day | ORAL | 3 refills | Status: DC

## 2020-11-08 NOTE — Patient Instructions (Addendum)
Medication Instructions:  No changes *If you need a refill on your cardiac medications before your next appointment, please call your pharmacy*  Lab Work: Your physician recommends that you return for lab work today: CMP, CBC, Lipids If you have labs (blood work) drawn today and your tests are completely normal, you will receive your results only by: Marland Kitchen MyChart Message (if you have MyChart) OR . A paper copy in the mail If you have any lab test that is abnormal or we need to change your treatment, we will call you to review the results.  Follow-Up: At [facility], you and your health needs are our priority.  As part of our continuing mission to provide you with exceptional heart care, we have created designated Provider Care Teams.  These Care Teams include your primary Cardiologist (physician) and Advanced Practice Providers (APPs -  Physician Assistants and Nurse Practitioners) who all work together to provide you with the care you need, when you need it.  Your next appointment:   12 month(s) You will receive a reminder letter in the mail two months in advance. If you don't receive a letter, please call our office to schedule the follow-up appointment.  The format for your next appointment:   In Person  Provider:   Minus Breeding, MD  Other Instructions [facility]

## 2020-11-11 LAB — CBC
Hematocrit: 42.7 % (ref 34.0–46.6)
Hemoglobin: 13.8 g/dL (ref 11.1–15.9)
MCH: 30.9 pg (ref 26.6–33.0)
MCHC: 32.3 g/dL (ref 31.5–35.7)
MCV: 96 fL (ref 79–97)
Platelets: 276 10*3/uL (ref 150–450)
RBC: 4.46 x10E6/uL (ref 3.77–5.28)
RDW: 12.5 % (ref 11.7–15.4)
WBC: 9.6 10*3/uL (ref 3.4–10.8)

## 2020-11-12 DIAGNOSIS — R3915 Urgency of urination: Secondary | ICD-10-CM | POA: Diagnosis not present

## 2020-11-12 DIAGNOSIS — N3000 Acute cystitis without hematuria: Secondary | ICD-10-CM | POA: Diagnosis not present

## 2020-11-19 DIAGNOSIS — R350 Frequency of micturition: Secondary | ICD-10-CM | POA: Diagnosis not present

## 2020-11-19 DIAGNOSIS — N3020 Other chronic cystitis without hematuria: Secondary | ICD-10-CM | POA: Diagnosis not present

## 2020-11-19 DIAGNOSIS — R3915 Urgency of urination: Secondary | ICD-10-CM | POA: Diagnosis not present

## 2020-11-19 DIAGNOSIS — R300 Dysuria: Secondary | ICD-10-CM | POA: Diagnosis not present

## 2020-11-22 DIAGNOSIS — Z461 Encounter for fitting and adjustment of hearing aid: Secondary | ICD-10-CM | POA: Diagnosis not present

## 2020-11-22 DIAGNOSIS — H903 Sensorineural hearing loss, bilateral: Secondary | ICD-10-CM | POA: Diagnosis not present

## 2020-11-26 DIAGNOSIS — Z1389 Encounter for screening for other disorder: Secondary | ICD-10-CM | POA: Diagnosis not present

## 2020-11-26 DIAGNOSIS — Z23 Encounter for immunization: Secondary | ICD-10-CM | POA: Diagnosis not present

## 2020-11-26 DIAGNOSIS — Z0000 Encounter for general adult medical examination without abnormal findings: Secondary | ICD-10-CM | POA: Diagnosis not present

## 2020-11-28 ENCOUNTER — Other Ambulatory Visit: Payer: Self-pay | Admitting: Family Medicine

## 2020-11-28 DIAGNOSIS — Z1231 Encounter for screening mammogram for malignant neoplasm of breast: Secondary | ICD-10-CM

## 2020-12-20 DIAGNOSIS — N3020 Other chronic cystitis without hematuria: Secondary | ICD-10-CM | POA: Diagnosis not present

## 2020-12-20 DIAGNOSIS — N3941 Urge incontinence: Secondary | ICD-10-CM | POA: Diagnosis not present

## 2021-01-16 DIAGNOSIS — E785 Hyperlipidemia, unspecified: Secondary | ICD-10-CM | POA: Diagnosis not present

## 2021-01-16 DIAGNOSIS — I700 Atherosclerosis of aorta: Secondary | ICD-10-CM | POA: Diagnosis not present

## 2021-01-16 DIAGNOSIS — I2510 Atherosclerotic heart disease of native coronary artery without angina pectoris: Secondary | ICD-10-CM | POA: Diagnosis not present

## 2021-01-16 DIAGNOSIS — R7301 Impaired fasting glucose: Secondary | ICD-10-CM | POA: Diagnosis not present

## 2021-01-16 DIAGNOSIS — I10 Essential (primary) hypertension: Secondary | ICD-10-CM | POA: Diagnosis not present

## 2021-01-21 ENCOUNTER — Other Ambulatory Visit: Payer: Self-pay

## 2021-01-21 ENCOUNTER — Ambulatory Visit
Admission: RE | Admit: 2021-01-21 | Discharge: 2021-01-21 | Disposition: A | Discharge status: Home / Self Care | Payer: Medicare Other | Source: Ambulatory Visit | Attending: Family Medicine | Admitting: Family Medicine

## 2021-01-21 DIAGNOSIS — Z1231 Encounter for screening mammogram for malignant neoplasm of breast: Secondary | ICD-10-CM | POA: Diagnosis not present

## 2021-02-11 DIAGNOSIS — R350 Frequency of micturition: Secondary | ICD-10-CM | POA: Diagnosis not present

## 2021-02-11 DIAGNOSIS — R3915 Urgency of urination: Secondary | ICD-10-CM | POA: Diagnosis not present

## 2021-02-11 DIAGNOSIS — R300 Dysuria: Secondary | ICD-10-CM | POA: Diagnosis not present

## 2021-02-11 DIAGNOSIS — N3020 Other chronic cystitis without hematuria: Secondary | ICD-10-CM | POA: Diagnosis not present

## 2021-02-20 DIAGNOSIS — Z20822 Contact with and (suspected) exposure to covid-19: Secondary | ICD-10-CM | POA: Diagnosis not present

## 2021-06-12 DIAGNOSIS — N3020 Other chronic cystitis without hematuria: Secondary | ICD-10-CM | POA: Diagnosis not present

## 2021-06-12 DIAGNOSIS — N952 Postmenopausal atrophic vaginitis: Secondary | ICD-10-CM | POA: Diagnosis not present

## 2021-07-01 DIAGNOSIS — Z471 Aftercare following joint replacement surgery: Secondary | ICD-10-CM | POA: Diagnosis not present

## 2021-07-01 DIAGNOSIS — Z96611 Presence of right artificial shoulder joint: Secondary | ICD-10-CM | POA: Diagnosis not present

## 2021-07-12 DIAGNOSIS — Z23 Encounter for immunization: Secondary | ICD-10-CM | POA: Diagnosis not present

## 2021-09-16 ENCOUNTER — Encounter (HOSPITAL_COMMUNITY): Payer: Self-pay | Admitting: Emergency Medicine

## 2021-09-16 ENCOUNTER — Other Ambulatory Visit: Payer: Self-pay

## 2021-09-16 ENCOUNTER — Ambulatory Visit (HOSPITAL_COMMUNITY)
Admission: EM | Admit: 2021-09-16 | Discharge: 2021-09-16 | Disposition: A | Discharge status: Home / Self Care | Payer: Medicare Other | Attending: Family Medicine | Admitting: Family Medicine

## 2021-09-16 DIAGNOSIS — U071 COVID-19: Secondary | ICD-10-CM | POA: Insufficient documentation

## 2021-09-16 DIAGNOSIS — J069 Acute upper respiratory infection, unspecified: Secondary | ICD-10-CM | POA: Insufficient documentation

## 2021-09-16 LAB — POC INFLUENZA A AND B ANTIGEN (URGENT CARE ONLY)
INFLUENZA A ANTIGEN, POC: NEGATIVE
INFLUENZA B ANTIGEN, POC: NEGATIVE

## 2021-09-16 NOTE — Discharge Instructions (Addendum)
Your flu test was negative.  You have been swabbed for COVID, and the test will result in the next 24 hours. Our staff will call you if positive. If the test is positive, you should quarantine for 5 days.   You can take mucinex or robitussin DM for the cough/congestion.

## 2021-09-16 NOTE — ED Provider Notes (Signed)
[place]    CSN: [id number] Arrival date & time: 09/16/21  0827      History   Chief Complaint Chief Complaint  Patient presents with   Cough   Nasal Congestion   Headache    HPI Melissa Lane is a 87 y.o. female.    Cough Associated symptoms: headaches   Headache Associated symptoms: cough   Here for rhinorrhea that began about 12/21, and then got thicker and more yellow in the last 2 days or so. No f/c/v/d. No dyspnea. She would like testing for flu and for covid, since she is planning to visit family tomorrow.  Past Medical History:  Diagnosis Date   Arthritis    Coronary artery calcification seen on CAT scan 2016   Hyperlipidemia    Hypertension    Peri-rectal abscess 2013   SVT (supraventricular tachycardia) ([place]) 05/2018    Patient Active Problem List   Diagnosis Date Noted   Precordial chest pain 10/05/2019   Palpitations 10/05/2019   Educated about COVID-19 virus infection 10/05/2019   S/P shoulder replacement, right 08/26/2018   Allergic rhinitis 06/03/2016   Essential hypertension, benign 06/03/2016   Impaired fasting glucose 06/03/2016   Peripheral neuropathy 06/03/2016   Elevated coronary artery calcium score 02/21/2016   Bruit of right carotid artery 02/21/2016   Pseudophakia of both eyes 06/01/2015   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       Home Medications    Prior to Admission medications   Medication Sig Start Date End Date Taking? Authorizing Provider  aspirin EC 81 MG tablet Take 81 mg by mouth daily.    [provider]  atorvastatin (LIPITOR) 40 MG tablet Take 40 mg by mouth daily.    [provider]  hydrocortisone 2.5 % lotion Apply topically 2 (two) times daily. Left external ear 09/03/18   Arnaldo Natal, MD  losartan-hydrochlorothiazide [facility]) 100-12.5 MG per tablet Take 1 tablet by mouth daily.    [provider]  metoprolol succinate (TOPROL-XL) 25 MG 24 hr tablet Take 1 tablet (25 mg total) by mouth daily. 11/08/20   Minus Breeding, MD  Multiple Vitamin (MULTIVITAMIN) capsule Take 1 capsule by mouth daily.    [provider]  neomycin-polymyxin-hydrocortisone (CORTISPORIN) 3.5-10000-1 OTIC suspension Place 4 drops into the left ear 3 (three) times daily. 09/03/18   Arnaldo Natal, MD    Family History Family History  Problem Relation Age of Onset   Alzheimer's disease Mother        died at 42   Breast cancer Mother            Heart attack Father 56       died at 42   Crohn's disease Son     Social History Social History   Tobacco Use   Smoking status: Never   Smokeless tobacco: Never  Vaping Use  Vaping Use: Never used  Substance Use Topics   Alcohol use: Yes    Alcohol/week: 1.0 - 2.0 standard drink    Types: 1 - 2 Standard drinks or equivalent per week    Comment: occasionally.    Drug use: No    Frequency: 2.0 times per week     Allergies   Ciprofloxacin, Macrodantin [nitrofurantoin macrocrystal], Metronidazole, Naproxen, and Vitamin d   Review of Systems Review of Systems  Respiratory:  Positive for cough.   Neurological:  Positive for headaches.    Physical Exam Triage Vital Signs ED Triage Vitals  Enc Vitals Group     BP 09/16/21 0847 (!) 153/73     Pulse Rate 09/16/21 0847 75     Resp 09/16/21 0847 20     Temp 09/16/21 0847  98.2 F (36.8 C)     Temp Source 09/16/21 0847 Oral     SpO2 09/16/21 0847 96 %     Weight --      Height --      Head Circumference --      Peak Flow --      Pain Score 09/16/21 0846 5     Pain Loc --      Pain Edu? --      Excl. in [place]? --    No data found.  Updated Vital Signs BP (!) 153/73 (BP Location: Right Arm)    Pulse 75    Temp 98.2 F (36.8 C) (Oral)    Resp 20    SpO2 96%   Visual Acuity Right Eye Distance:   Left Eye Distance:   Bilateral Distance:    Right Eye Near:   Left Eye Near:    Bilateral Near:     Physical Exam Vitals and nursing note reviewed.  Constitutional:      General: She is not in acute distress.    Appearance: She is well-developed.  HENT:     Right Ear: Tympanic membrane and ear canal normal.     Left Ear: Tympanic membrane and ear canal normal.     Nose: Nose normal.     Mouth/Throat:     Mouth: Mucous membranes are moist.     Pharynx: No oropharyngeal exudate or posterior oropharyngeal erythema.  Eyes:     Extraocular Movements: Extraocular movements intact.     Conjunctiva/sclera: Conjunctivae normal.     Pupils: Pupils are equal, round, and reactive to light.  Cardiovascular:     Rate and Rhythm: Normal rate and regular rhythm.     Heart sounds: No murmur heard. Pulmonary:     Effort: Pulmonary effort is normal. No respiratory distress.     Breath sounds: Normal breath sounds.  Musculoskeletal:        General: No swelling.     Cervical back: Neck supple.  Skin:    Capillary Refill: Capillary refill takes less than 2 seconds.     Coloration: Skin is not jaundiced or pale.  Neurological:     Mental Status: She is alert and oriented to person, place, and time.  Psychiatric:        Behavior: Behavior normal.     UC Treatments / Results  Labs (all labs ordered are listed, but only abnormal results are displayed) Labs Reviewed  SARS CORONAVIRUS 2 (TAT 6-24 HRS)  POC INFLUENZA A AND B ANTIGEN (URGENT CARE ONLY)     EKG   Radiology No results found.  Procedures Procedures (including critical care time)  Medications  Ordered in UC Medications - No data to display  Initial Impression / Assessment and Plan / UC Course  I have reviewed the triage vital signs and the nursing notes.  Pertinent labs & imaging results that were available during my care of the patient were reviewed by me and considered in my medical decision making (see chart for details).     Flu test is negative.  Final Clinical Impressions(s) / UC Diagnoses   Final diagnoses:  Acute upper respiratory infection     Discharge Instructions      Your flu test was negative.  You have been swabbed for COVID, and the test will result in the next 24 hours. Our staff will call you if positive. If the test is positive, you should quarantine for 5 days.   You can take mucinex or robitussin DM for the cough/congestion.     ED Prescriptions   None    PDMP not reviewed this encounter.   Barrett Henle, MD 09/16/21 [phone]

## 2021-09-16 NOTE — ED Triage Notes (Signed)
Pt had runny nose for 3 days. Reports was clear til yesterday when turned yellow and started having headache. Wanting testing for covid and flu before supposed to go see family tomorrow.

## 2021-09-17 DIAGNOSIS — J029 Acute pharyngitis, unspecified: Secondary | ICD-10-CM | POA: Diagnosis not present

## 2021-09-17 DIAGNOSIS — U071 COVID-19: Secondary | ICD-10-CM | POA: Diagnosis not present

## 2021-09-17 DIAGNOSIS — R519 Headache, unspecified: Secondary | ICD-10-CM | POA: Diagnosis not present

## 2021-09-17 LAB — SARS CORONAVIRUS 2 (TAT 6-24 HRS): SARS Coronavirus 2: POSITIVE — AB

## 2021-11-05 ENCOUNTER — Encounter (HOSPITAL_BASED_OUTPATIENT_CLINIC_OR_DEPARTMENT_OTHER): Payer: Self-pay

## 2021-11-05 ENCOUNTER — Other Ambulatory Visit: Payer: Self-pay | Admitting: Family Medicine

## 2021-11-05 ENCOUNTER — Ambulatory Visit (HOSPITAL_BASED_OUTPATIENT_CLINIC_OR_DEPARTMENT_OTHER)
Admission: RE | Admit: 2021-11-05 | Discharge: 2021-11-05 | Disposition: A | Discharge status: Home / Self Care | Payer: Medicare Other | Source: Ambulatory Visit | Attending: Family Medicine | Admitting: Family Medicine

## 2021-11-05 ENCOUNTER — Other Ambulatory Visit: Payer: Self-pay

## 2021-11-05 DIAGNOSIS — L821 Other seborrheic keratosis: Secondary | ICD-10-CM | POA: Diagnosis not present

## 2021-11-05 DIAGNOSIS — R519 Headache, unspecified: Secondary | ICD-10-CM | POA: Diagnosis not present

## 2021-11-05 DIAGNOSIS — R42 Dizziness and giddiness: Secondary | ICD-10-CM | POA: Diagnosis not present

## 2021-11-05 DIAGNOSIS — L218 Other seborrheic dermatitis: Secondary | ICD-10-CM | POA: Diagnosis not present

## 2021-11-05 DIAGNOSIS — I6381 Other cerebral infarction due to occlusion or stenosis of small artery: Secondary | ICD-10-CM | POA: Diagnosis not present

## 2021-11-05 DIAGNOSIS — D485 Neoplasm of uncertain behavior of skin: Secondary | ICD-10-CM | POA: Diagnosis not present

## 2021-11-05 DIAGNOSIS — G44059 Short lasting unilateral neuralgiform headache with conjunctival injection and tearing (SUNCT), not intractable: Secondary | ICD-10-CM

## 2021-11-05 DIAGNOSIS — L298 Other pruritus: Secondary | ICD-10-CM | POA: Diagnosis not present

## 2021-11-05 DIAGNOSIS — L986 Other infiltrative disorders of the skin and subcutaneous tissue: Secondary | ICD-10-CM | POA: Diagnosis not present

## 2021-11-05 DIAGNOSIS — L814 Other melanin hyperpigmentation: Secondary | ICD-10-CM | POA: Diagnosis not present

## 2021-11-18 ENCOUNTER — Other Ambulatory Visit (HOSPITAL_BASED_OUTPATIENT_CLINIC_OR_DEPARTMENT_OTHER): Payer: Medicare Other

## 2021-11-18 ENCOUNTER — Other Ambulatory Visit: Payer: Medicare Other

## 2021-11-28 DIAGNOSIS — D485 Neoplasm of uncertain behavior of skin: Secondary | ICD-10-CM | POA: Diagnosis not present

## 2021-11-29 DIAGNOSIS — Z0000 Encounter for general adult medical examination without abnormal findings: Secondary | ICD-10-CM | POA: Diagnosis not present

## 2021-11-29 DIAGNOSIS — Z1389 Encounter for screening for other disorder: Secondary | ICD-10-CM | POA: Diagnosis not present

## 2021-12-02 DIAGNOSIS — I700 Atherosclerosis of aorta: Secondary | ICD-10-CM | POA: Diagnosis not present

## 2021-12-02 DIAGNOSIS — R238 Other skin changes: Secondary | ICD-10-CM | POA: Diagnosis not present

## 2021-12-02 DIAGNOSIS — E785 Hyperlipidemia, unspecified: Secondary | ICD-10-CM | POA: Diagnosis not present

## 2021-12-02 DIAGNOSIS — R7301 Impaired fasting glucose: Secondary | ICD-10-CM | POA: Diagnosis not present

## 2021-12-02 DIAGNOSIS — I2510 Atherosclerotic heart disease of native coronary artery without angina pectoris: Secondary | ICD-10-CM | POA: Diagnosis not present

## 2021-12-02 DIAGNOSIS — I10 Essential (primary) hypertension: Secondary | ICD-10-CM | POA: Diagnosis not present

## 2021-12-11 ENCOUNTER — Other Ambulatory Visit: Payer: Self-pay | Admitting: Cardiology

## 2021-12-23 ENCOUNTER — Other Ambulatory Visit: Payer: Self-pay | Admitting: Family Medicine

## 2021-12-23 DIAGNOSIS — Z1231 Encounter for screening mammogram for malignant neoplasm of breast: Secondary | ICD-10-CM

## 2021-12-25 DIAGNOSIS — H47021 Hemorrhage in optic nerve sheath, right eye: Secondary | ICD-10-CM | POA: Diagnosis not present

## 2021-12-25 DIAGNOSIS — H5203 Hypermetropia, bilateral: Secondary | ICD-10-CM | POA: Diagnosis not present

## 2021-12-25 DIAGNOSIS — H52203 Unspecified astigmatism, bilateral: Secondary | ICD-10-CM | POA: Diagnosis not present

## 2021-12-25 DIAGNOSIS — H524 Presbyopia: Secondary | ICD-10-CM | POA: Diagnosis not present

## 2021-12-25 DIAGNOSIS — H04123 Dry eye syndrome of bilateral lacrimal glands: Secondary | ICD-10-CM | POA: Diagnosis not present

## 2021-12-25 DIAGNOSIS — Z961 Presence of intraocular lens: Secondary | ICD-10-CM | POA: Diagnosis not present

## 2021-12-26 DIAGNOSIS — D485 Neoplasm of uncertain behavior of skin: Secondary | ICD-10-CM | POA: Diagnosis not present

## 2022-01-13 ENCOUNTER — Other Ambulatory Visit: Payer: Self-pay | Admitting: Cardiology

## 2022-01-19 NOTE — Progress Notes (Signed)
?  ?Cardiology Office Note ? ? ?Date:  01/20/2022  ? ?ID:  Melissa Lane, DOB 08-26-1934, MRN [id number] ? ?PCP:  Marda Stalker, PA-C  ?Cardiologist:   Minus Breeding, MD ? ? ?Chief Complaint  ?Patient presents with  ? Palpitations  ? ? ?  ?History of Present Illness: ?Melissa Lane is a 88 y.o. female who presents for follow up of coronary calcium.  Since I last saw her she has done well. The patient denies any new symptoms such as chest discomfort, neck or arm discomfort. There has been no new shortness of breath, PND or orthopnea. There have been no reported palpitations, presyncope or syncope.  She did have COVID treated with Paxlovid.  She had shingles.  She has a melanoma that is being resected.  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  She only has a rare cardiac issues with mild palpitations.  She is not having any presyncope or syncope.  She is not having any chest pressure, neck or arm discomfort.  She does her household chores but is relatively inactive. ? ? ?Past Medical History:  ?Diagnosis Date  ? Arthritis   ? Coronary artery calcification seen on CAT scan 2016  ? Hyperlipidemia   ? Hypertension   ? Peri-rectal abscess 2013  ? SVT (supraventricular tachycardia) ([place]) 05/2018  ? ? ?Past Surgical History:  ?Procedure Laterality Date  ? ABDOMINAL HYSTERECTOMY  1978  ? Partial  ? BREAST BIOPSY Left 07/26/1999  ? CATARACT EXTRACTION    ? TOTAL SHOULDER ARTHROPLASTY Right 08/26/2018  ? Procedure: RIGHT TOTAL SHOULDER ARTHROPLASTY;  Surgeon: Tania Ade, MD;  Location: [facility];  Service: Orthopedics;  Laterality: Right;  ? ? ? ?Current Outpatient Medications  ?Medication Sig Dispense Refill  ? aspirin EC 81 MG tablet Take 81 mg by mouth daily.    ? atorvastatin (LIPITOR) 40 MG tablet Take 40 mg by mouth daily.    ? ketoconazole (NIZORAL) 2 % shampoo Apply 1 application. topically daily.    ? losartan-hydrochlorothiazide (HYZAAR) 100-12.5 MG per tablet Take 1 tablet by mouth daily.    ?  Multiple Vitamin (MULTIVITAMIN) capsule Take 1 capsule by mouth daily.    ? hydrocortisone 2.5 % lotion Apply topically 2 (two) times daily. Left external ear 59 mL 0  ? metoprolol succinate (TOPROL-XL) 25 MG 24 hr tablet Take 1 tablet (25 mg total) by mouth daily. 90 tablet 3  ? neomycin-polymyxin-hydrocortisone (CORTISPORIN) 3.5-10000-1 OTIC suspension Place 4 drops into the left ear 3 (three) times daily. 10 mL 0  ? ?No current facility-administered medications for this visit.  ? ? ?Allergies:   Ciprofloxacin, Macrodantin [nitrofurantoin macrocrystal], Metronidazole, Naproxen, and Vitamin d  ? ?ROS:  Please see the history of present illness.   Otherwise, review of systems are positive for none .   All other systems are reviewed and negative.  ? ? ?PHYSICAL EXAM: ?VS:  BP 125/76   Pulse (!) 56   Ht '5\' 5"'$  (1.651 m)   Wt 217 lb 9.6 oz (98.7 kg)   SpO2 97%   BMI 36.21 kg/m?  , BMI Body mass index is 36.21 kg/m?. ?GENERAL:  Well appearing ?NECK:  No jugular venous distention, waveform within normal limits, carotid upstroke brisk and symmetric, no bruits, no thyromegaly ?LUNGS:  Clear to auscultation bilaterally ?CHEST:  Unremarkable ?HEART:  PMI not displaced or sustained,S1 and S2 within normal limits, no S3, no S4, no clicks, no rubs, no murmurs ?ABD:  Flat, positive bowel  sounds normal in frequency in pitch, no bruits, no rebound, no guarding, no midline pulsatile mass, no hepatomegaly, no splenomegaly ?EXT:  2 plus pulses throughout, no edema, no cyanosis no clubbing ? ? ?EKG:  EKG is  ordered today. ?The ekg ordered today demonstrates NSR, rate 56, axis within normal limits, intervals within normal limits, no acute ST-T wave changes. ? ? ?Recent Labs: ?No results found for requested labs within last 8760 hours.  ? ? ?Lipid Panel ?   ?Component Value Date/Time  ? CHOL 155 11/08/2020 1126  ? TRIG 374 (H) 11/08/2020 1126  ? HDL 46 11/08/2020 1126  ? CHOLHDL 3.4 11/08/2020 1126  ? [place] 53 11/08/2020 1126   ? ?  ? ?Wt Readings from Last 3 Encounters:  ?01/20/22 217 lb 9.6 oz (98.7 kg)  ?11/08/20 225 lb (102.1 kg)  ?10/06/19 220 lb (99.8 kg)  ?  ? ? ?Other studies Reviewed: ?Additional studies/ records that were reviewed today include: Labs ?Review of the above record:  Please see elsewhere in the note.   ? ? ?ASSESSMENT AND PLAN: ? ?ELEVATED CORONARY CALCIUM:   She has no new symptoms.  She will continue with primary risk reduction.  ? ?PALPITATIONS:   She tolerates the low-dose beta-blocker and I will continue with meds as listed. ? ?RISK REDUCTION: Her LDL was 75 with an HDL of 46.  No change in therapy.  73 last year with an HDL of 48.  No change in therapy but I will repeat this today.  ? ? ?Current medicines are reviewed at length with the patient today.  The patient does not have concerns regarding medicines. ? ?The following changes have been made:   ? ?Labs/ tests ordered today include:  ? ?Orders Placed This Encounter  ?Procedures  ? EKG 12-Lead  ? ? ? ?Disposition:   FU with me in 12 months.  ? ? ?Signed, ?Minus Breeding, MD  ?01/20/2022 1:11 PM    ?[place] ? ? ?

## 2022-01-20 ENCOUNTER — Encounter: Payer: Self-pay | Admitting: Cardiology

## 2022-01-20 ENCOUNTER — Ambulatory Visit (INDEPENDENT_AMBULATORY_CARE_PROVIDER_SITE_OTHER): Payer: Medicare Other | Admitting: Cardiology

## 2022-01-20 VITALS — BP 125/76 | HR 56 | Ht 65.0 in | Wt 217.6 lb

## 2022-01-20 DIAGNOSIS — R931 Abnormal findings on diagnostic imaging of heart and coronary circulation: Secondary | ICD-10-CM

## 2022-01-20 MED ORDER — METOPROLOL SUCCINATE ER 25 MG PO TB24: 25.0000 mg | ORAL_TABLET | Freq: Every day | ORAL | 3 refills | Status: DC

## 2022-01-20 NOTE — Patient Instructions (Signed)

## 2022-01-22 ENCOUNTER — Ambulatory Visit
Admission: RE | Admit: 2022-01-22 | Discharge: 2022-01-22 | Disposition: A | Discharge status: Home / Self Care | Payer: Medicare Other | Source: Ambulatory Visit | Attending: Family Medicine | Admitting: Family Medicine

## 2022-01-22 DIAGNOSIS — Z1231 Encounter for screening mammogram for malignant neoplasm of breast: Secondary | ICD-10-CM | POA: Diagnosis not present

## 2022-01-23 DIAGNOSIS — D0339 Melanoma in situ of other parts of face: Secondary | ICD-10-CM | POA: Diagnosis not present

## 2022-01-28 DIAGNOSIS — Z461 Encounter for fitting and adjustment of hearing aid: Secondary | ICD-10-CM | POA: Diagnosis not present

## 2022-01-31 DIAGNOSIS — R300 Dysuria: Secondary | ICD-10-CM | POA: Diagnosis not present

## 2022-01-31 DIAGNOSIS — N3020 Other chronic cystitis without hematuria: Secondary | ICD-10-CM | POA: Diagnosis not present

## 2022-02-10 ENCOUNTER — Emergency Department (HOSPITAL_COMMUNITY)
Admission: EM | Admit: 2022-02-10 | Discharge: 2022-02-10 | Disposition: A | Discharge status: Home / Self Care | Payer: Medicare Other | Attending: Student | Admitting: Student

## 2022-02-10 ENCOUNTER — Other Ambulatory Visit: Payer: Self-pay

## 2022-02-10 ENCOUNTER — Emergency Department (HOSPITAL_COMMUNITY): Payer: Medicare Other

## 2022-02-10 ENCOUNTER — Encounter (HOSPITAL_COMMUNITY): Payer: Self-pay | Admitting: Emergency Medicine

## 2022-02-10 DIAGNOSIS — I10 Essential (primary) hypertension: Secondary | ICD-10-CM | POA: Diagnosis not present

## 2022-02-10 DIAGNOSIS — Z7982 Long term (current) use of aspirin: Secondary | ICD-10-CM | POA: Diagnosis not present

## 2022-02-10 DIAGNOSIS — R109 Unspecified abdominal pain: Secondary | ICD-10-CM | POA: Insufficient documentation

## 2022-02-10 DIAGNOSIS — Z96611 Presence of right artificial shoulder joint: Secondary | ICD-10-CM | POA: Diagnosis not present

## 2022-02-10 DIAGNOSIS — Z79899 Other long term (current) drug therapy: Secondary | ICD-10-CM | POA: Insufficient documentation

## 2022-02-10 DIAGNOSIS — K8020 Calculus of gallbladder without cholecystitis without obstruction: Secondary | ICD-10-CM | POA: Diagnosis not present

## 2022-02-10 DIAGNOSIS — K5900 Constipation, unspecified: Secondary | ICD-10-CM | POA: Insufficient documentation

## 2022-02-10 DIAGNOSIS — K5730 Diverticulosis of large intestine without perforation or abscess without bleeding: Secondary | ICD-10-CM | POA: Diagnosis not present

## 2022-02-10 LAB — URINALYSIS, ROUTINE W REFLEX MICROSCOPIC
Bilirubin Urine: NEGATIVE
Glucose, UA: NEGATIVE mg/dL
Hgb urine dipstick: NEGATIVE
Ketones, ur: NEGATIVE mg/dL
Leukocytes,Ua: NEGATIVE
Nitrite: NEGATIVE
Protein, ur: NEGATIVE mg/dL
Specific Gravity, Urine: 1.006 (ref 1.005–1.030)
pH: 5 (ref 5.0–8.0)

## 2022-02-10 LAB — COMPREHENSIVE METABOLIC PANEL
ALT: 17 U/L (ref 0–44)
AST: 23 U/L (ref 15–41)
Albumin: 4.1 g/dL (ref 3.5–5.0)
Alkaline Phosphatase: 113 U/L (ref 38–126)
Anion gap: 11 (ref 5–15)
BUN: 19 mg/dL (ref 8–23)
CO2: 23 mmol/L (ref 22–32)
Calcium: 9.7 mg/dL (ref 8.9–10.3)
Chloride: 102 mmol/L (ref 98–111)
Creatinine, Ser: 0.99 mg/dL (ref 0.44–1.00)
GFR, Estimated: 56 mL/min — ABNORMAL LOW (ref >60)
Glucose, Bld: 122 mg/dL — ABNORMAL HIGH (ref 70–99)
Potassium: 3.8 mmol/L (ref 3.5–5.1)
Sodium: 136 mmol/L (ref 135–145)
Total Bilirubin: 0.6 mg/dL (ref 0.3–1.2)
Total Protein: 6.8 g/dL (ref 6.5–8.1)

## 2022-02-10 LAB — CBC WITH DIFFERENTIAL/PLATELET
Abs Immature Granulocytes: 0.03 10*3/uL (ref 0.00–0.07)
Basophils Absolute: 0 10*3/uL (ref 0.0–0.1)
Basophils Relative: 0 %
Eosinophils Absolute: 0.1 10*3/uL (ref 0.0–0.5)
Eosinophils Relative: 1 %
HCT: 41 % (ref 36.0–46.0)
Hemoglobin: 13.4 g/dL (ref 12.0–15.0)
Immature Granulocytes: 0 %
Lymphocytes Relative: 26 %
Lymphs Abs: 2.5 10*3/uL (ref 0.7–4.0)
MCH: 31.3 pg (ref 26.0–34.0)
MCHC: 32.7 g/dL (ref 30.0–36.0)
MCV: 95.8 fL (ref 80.0–100.0)
Monocytes Absolute: 0.5 10*3/uL (ref 0.1–1.0)
Monocytes Relative: 5 %
Neutro Abs: 6.6 10*3/uL (ref 1.7–7.7)
Neutrophils Relative %: 68 %
Platelets: 245 10*3/uL (ref 150–400)
RBC: 4.28 MIL/uL (ref 3.87–5.11)
RDW: 13.5 % (ref 11.5–15.5)
WBC: 9.8 10*3/uL (ref 4.0–10.5)
nRBC: 0 % (ref 0.0–0.2)

## 2022-02-10 LAB — LIPASE, BLOOD: Lipase: 29 U/L (ref 11–51)

## 2022-02-10 NOTE — ED Provider Notes (Signed)
[facility] EMERGENCY DEPARTMENT Provider Note  CSN: [id number] Arrival date & time: 02/10/22 1413  Chief Complaint(s) Abdominal Pain  HPI Melissa Lane is a 88 y.o. female with PMH perirectal abscess, HTN, HLD, recurrent UTI currently on round 3 of oral antibiotics for a persistent UTI who presents to the emergency department for evaluation of abdominal pain and constipation.  Patient states that she has not had a healthy bowel movement in 3 days.  She states that she has tried MiraLAX and Dulcolax with only small bowel movements.  She states that she is primarily here in the emergency department because she is concerned she has a "bowel blockage".  She states that her abdominal pain is mild and she has not had any vomiting, headache, fever, chest pain, shortness of breath or any other systemic symptoms.   Past Medical History Past Medical History:  Diagnosis Date   Arthritis    Coronary artery calcification seen on CAT scan 2016   Hyperlipidemia    Hypertension    Peri-rectal abscess 2013   SVT (supraventricular tachycardia) ([place]) 05/2018   Patient Active Problem List   Diagnosis Date Noted   Precordial chest pain 10/05/2019   Palpitations 10/05/2019   Educated about COVID-19 virus infection 10/05/2019   S/P shoulder replacement, right 08/26/2018   Allergic rhinitis 06/03/2016   Essential hypertension, benign 06/03/2016   Impaired fasting glucose 06/03/2016   Peripheral neuropathy 06/03/2016   Elevated coronary artery calcium score 02/21/2016   Bruit of right carotid artery 02/21/2016   Pseudophakia of both eyes 06/01/2015   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 Authorizing Provider  aspirin EC 81 MG tablet Take 81 mg by mouth daily.    [provider]  atorvastatin (LIPITOR) 40 MG tablet Take 40 mg by mouth daily.    [provider]  hydrocortisone 2.5 % lotion Apply topically 2 (two) times daily. Left external ear 09/03/18   Arnaldo Natal, MD  ketoconazole (NIZORAL) 2 % shampoo Apply 1 application. topically daily. 11/05/21   [provider]  losartan-hydrochlorothiazide (HYZAAR) 100-12.5 MG per tablet Take 1 tablet by mouth daily.    [provider]  metoprolol succinate (TOPROL-XL) 25 MG 24 hr tablet Take 1 tablet (25 mg total) by mouth daily. 01/20/22   Minus Breeding, MD  Multiple Vitamin (MULTIVITAMIN) capsule Take 1 capsule by mouth daily.    [provider]  neomycin-polymyxin-hydrocortisone (CORTISPORIN) 3.5-10000-1 OTIC suspension Place 4 drops into the left ear 3 (three) times daily. 09/03/18   Arnaldo Natal, MD  Past Surgical History Past Surgical History:  Procedure Laterality Date   ABDOMINAL HYSTERECTOMY  1978   Partial   BREAST BIOPSY Left 07/26/1999   CATARACT EXTRACTION     TOTAL SHOULDER ARTHROPLASTY Right 08/26/2018   Procedure: RIGHT TOTAL SHOULDER ARTHROPLASTY;  Surgeon: Tania Ade, MD;  Location: [facility];  Service: Orthopedics;  Laterality: Right;   Family History Family History  Problem Relation Age of Onset   Alzheimer's disease Mother        died at 62   Breast cancer Mother            Heart attack Father 9       died at 46   Crohn's disease Son     Social History Social History   Tobacco Use   Smoking status: Never   Smokeless tobacco: Never  Vaping Use   Vaping Use: Never used  Substance Use Topics   Alcohol use: Yes    Alcohol/week: 1.0 - 2.0 standard drink    Types: 1 - 2 Standard drinks or equivalent per week     Comment: occasionally.    Drug use: No    Frequency: 2.0 times per week   Allergies Ciprofloxacin, Macrodantin [nitrofurantoin macrocrystal], Metronidazole, Naproxen, and Vitamin d  Review of Systems Review of Systems  Gastrointestinal:  Positive for abdominal pain and constipation.   Physical Exam Vital Signs  I have reviewed the triage vital signs BP (!) 169/81   Pulse 60   Temp 97.6 F (36.4 C) (Oral)   Resp 17   Ht '5\' 5"'$  (1.651 m)   Wt 98.4 kg   SpO2 100%   BMI 36.11 kg/m   Physical Exam Vitals and nursing note reviewed.  Constitutional:      General: She is not in acute distress.    Appearance: She is well-developed.  HENT:     Head: Normocephalic and atraumatic.  Eyes:     Conjunctiva/sclera: Conjunctivae normal.  Cardiovascular:     Rate and Rhythm: Normal rate and regular rhythm.     Heart sounds: No murmur heard. Pulmonary:     Effort: Pulmonary effort is normal. No respiratory distress.     Breath sounds: Normal breath sounds.  Abdominal:     Palpations: Abdomen is soft.     Tenderness: There is no abdominal tenderness.  Musculoskeletal:        General: No swelling.     Cervical back: Neck supple.  Skin:    General: Skin is warm and dry.     Capillary Refill: Capillary refill takes less than 2 seconds.  Neurological:     Mental Status: She is alert.  Psychiatric:        Mood and Affect: Mood normal.    ED Results and Treatments Labs (all labs ordered are listed, but only abnormal results are displayed) Labs Reviewed  COMPREHENSIVE METABOLIC PANEL - Abnormal; Notable for the following components:      Result Value   Glucose, Bld 122 (*)    GFR, Estimated 56 (*)    All other components within normal limits  URINE CULTURE  CBC WITH DIFFERENTIAL/PLATELET  LIPASE, BLOOD  URINALYSIS, ROUTINE W REFLEX MICROSCOPIC  Radiology No results found.  Pertinent labs & imaging results that were available during my care of the patient were reviewed by me and considered in my medical decision making (see MDM for details).  Medications Ordered in ED Medications - No data to display                                                                                                                                   Procedures Procedures  (including critical care time)  Medical Decision Making / ED Course   This patient presents to the ED for concern of constipation, this involves an extensive number of treatment options, and is a complaint that carries with it a high risk of complications and morbidity.  The differential diagnosis includes drug-induced constipation, dysmotility, SBO, decreased p.o. intake  MDM: Patient seen emergency room for evaluation of constipation, abdominal pain and concern for an obstruction.  Physical exam is unremarkable.  Laboratory evaluation unremarkable.  Urinalysis negative.  CT abdomen pelvis with no evidence of obstruction, diverticulosis with no diverticulitis.  Patient symptoms overall improved while here in the emergency department and she is safe for discharge with outpatient follow-up.  Patient discharged.   Additional history obtained:  -External records from outside source obtained and reviewed including: Chart review including previous notes, labs, imaging, consultation notes   Lab Tests: -I ordered, reviewed, and interpreted labs.   The pertinent results include:   Labs Reviewed  COMPREHENSIVE METABOLIC PANEL - Abnormal; Notable for the following components:      Result Value   Glucose, Bld 122 (*)    GFR, Estimated 56 (*)    All other components within normal limits  URINE CULTURE  CBC WITH DIFFERENTIAL/PLATELET  LIPASE, BLOOD  URINALYSIS, ROUTINE W REFLEX MICROSCOPIC       Imaging Studies ordered: I ordered imaging studies including CTAP I  independently visualized and interpreted imaging. I agree with the radiologist interpretation   Medicines ordered and prescription drug management: No orders of the defined types were placed in this encounter.   -I have reviewed the patients home medicines and have made adjustments as needed  Critical interventions none    Cardiac Monitoring: The patient was maintained on a cardiac monitor.  I personally viewed and interpreted the cardiac monitored which showed an underlying rhythm of: NSR  Social Determinants of Health:  Factors impacting patients care include: none   Reevaluation: After the interventions noted above, I reevaluated the patient and found that they have :improved  Co morbidities that complicate the patient evaluation  Past Medical History:  Diagnosis Date   Arthritis    Coronary artery calcification seen on CAT scan 2016   Hyperlipidemia    Hypertension    Peri-rectal abscess 2013   SVT (supraventricular tachycardia) ([place]) 05/2018      Dispostion: I considered admission for this patient, but with negative work-up, patient safe for discharge with outpatient follow-up, does not  meet inpatient criteria for admission.     Final Clinical Impression(s) / ED Diagnoses Final diagnoses:  None     '@PCDICTATION'$ @    Teressa Lower, MD 02/10/22 2244

## 2022-02-10 NOTE — ED Triage Notes (Signed)
Pt c/o constipation x 5 days. Self/OTC treatments ineffective, small amounts of stool each day but pt reports she still feels "full". Pt states she is on Doxy for UTI, this is 3rd antibiotic treatment recently for same.  No hx of SBO, no hx of major abd surg.

## 2022-02-10 NOTE — ED Notes (Signed)
Patient transported to CT 

## 2022-02-10 NOTE — ED Provider Triage Note (Signed)
Emergency Medicine Provider Triage Evaluation Note  Melissa Lane , a 88 y.o. female  was evaluated in triage.  Pt complains of abdominal pain and fullness. Reports constipation but small bowel movement today. Has tried multiple medications including Dulcolax and MiraLAX.  Has not been passing gas as she typically does.  Reports nausea but denies any vomiting.  Concerned about bowel obstruction but no history of bowel obstruction.  Recently on a third antibiotic in the past 2 weeks for UTI, currently on doxycycline.  Review of Systems  Positive: Constipation Negative: Vomiting  Physical Exam  BP (!) 160/78 (BP Location: Left Arm)   Pulse 71   Temp 97.6 F (36.4 C) (Oral)   Resp 16   SpO2 99%  Gen:   Awake, no distress   Resp:  Normal effort  MSK:   Moves extremities without difficulty  Other:  Abdomen soft without rebound or guarding  Medical Decision Making  Medically screening exam initiated at 2:48 PM.  Appropriate orders placed.  Melissa Lane was informed that the remainder of the evaluation will be completed by another provider, this initial triage assessment does not replace that evaluation, and the importance of remaining in the ED until their evaluation is complete.  Labs ordered    Delia Heady, [place] 02/10/22 1450

## 2022-02-10 NOTE — ED Notes (Signed)
Discharge instructions reviewed with patient. Patient verbalized understanding of instructions. Follow-up care and medications were reviewed. Patient ambulatory with steady gait. VSS upon discharge.  ?

## 2022-02-11 LAB — URINE CULTURE: Culture: <10000 — AB

## 2022-03-03 DIAGNOSIS — D485 Neoplasm of uncertain behavior of skin: Secondary | ICD-10-CM | POA: Diagnosis not present

## 2022-03-03 DIAGNOSIS — L988 Other specified disorders of the skin and subcutaneous tissue: Secondary | ICD-10-CM | POA: Diagnosis not present

## 2022-03-03 DIAGNOSIS — D2239 Melanocytic nevi of other parts of face: Secondary | ICD-10-CM | POA: Diagnosis not present

## 2022-03-04 DIAGNOSIS — D485 Neoplasm of uncertain behavior of skin: Secondary | ICD-10-CM | POA: Diagnosis not present

## 2022-03-04 DIAGNOSIS — L988 Other specified disorders of the skin and subcutaneous tissue: Secondary | ICD-10-CM | POA: Diagnosis not present

## 2022-04-28 DIAGNOSIS — H47021 Hemorrhage in optic nerve sheath, right eye: Secondary | ICD-10-CM | POA: Diagnosis not present

## 2022-05-12 ENCOUNTER — Ambulatory Visit (HOSPITAL_COMMUNITY)
Admission: EM | Admit: 2022-05-12 | Discharge: 2022-05-12 | Disposition: A | Discharge status: Home / Self Care | Payer: Medicare Other | Attending: Family Medicine | Admitting: Family Medicine

## 2022-05-12 ENCOUNTER — Ambulatory Visit (INDEPENDENT_AMBULATORY_CARE_PROVIDER_SITE_OTHER): Payer: Medicare Other

## 2022-05-12 ENCOUNTER — Encounter (HOSPITAL_COMMUNITY): Payer: Self-pay

## 2022-05-12 DIAGNOSIS — M25551 Pain in right hip: Secondary | ICD-10-CM | POA: Diagnosis not present

## 2022-05-12 DIAGNOSIS — M25552 Pain in left hip: Secondary | ICD-10-CM | POA: Diagnosis not present

## 2022-05-12 DIAGNOSIS — I8001 Phlebitis and thrombophlebitis of superficial vessels of right lower extremity: Secondary | ICD-10-CM

## 2022-05-12 DIAGNOSIS — R102 Pelvic and perineal pain: Secondary | ICD-10-CM | POA: Diagnosis not present

## 2022-05-12 MED ORDER — PREDNISONE 20 MG PO TABS: 40.0000 mg | ORAL_TABLET | Freq: Every day | ORAL | 0 refills | Status: AC

## 2022-05-12 NOTE — Discharge Instructions (Signed)
Your x-ray did show some mild arthritis of both hips.  Continue to wear the your compressive wrap on your right lower leg  Take prednisone 20 mg--2 daily for 5 days.  You can take Tylenol 500 mg--2 every 6 hours as needed for pain also  Follow-up with your primary care about these issues.

## 2022-05-12 NOTE — ED Provider Notes (Signed)
[facility]    CSN: [id number] Arrival date & time: 05/12/22  0932      History   Chief Complaint Chief Complaint  Patient presents with   Leg Pain    HPI Melissa Lane is a 88 y.o. female.    Leg Pain  Here with pain and bulging of her varicose veins on her right lower leg at about the mid posterior calf.  This has been going on a few days.  She also has pain that starts at her right buttock and wraps around to her right inguinal area.  No rash or fever.  No trauma  When she wears a compressive wrap on her right lower leg it helps that pain completely go away. Past Medical History:  Diagnosis Date   Arthritis    Coronary artery calcification seen on CAT scan 2016   Hyperlipidemia    Hypertension    Peri-rectal abscess 2013   SVT (supraventricular tachycardia) ([place]) 05/2018    Patient Active Problem List   Diagnosis Date Noted   Precordial chest pain 10/05/2019   Palpitations 10/05/2019   Educated about COVID-19 virus infection 10/05/2019   S/P shoulder replacement, right 08/26/2018   Allergic rhinitis 06/03/2016   Essential hypertension, benign 06/03/2016   Impaired fasting glucose 06/03/2016   Peripheral neuropathy 06/03/2016   Elevated coronary artery calcium score 02/21/2016   Bruit of right carotid artery 02/21/2016   Pseudophakia of both eyes 06/01/2015   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      Home Medications    Prior to Admission medications   Medication Sig Start Date End Date Taking? Authorizing Provider  predniSONE (DELTASONE) 20 MG tablet Take 2 tablets (40 mg total) by mouth daily with breakfast for 5 days. 05/12/22 05/17/22 Yes Barrett Henle, MD  aspirin EC 81 MG tablet Take 81 mg by mouth daily.    [provider]  atorvastatin (LIPITOR) 40 MG tablet Take 40 mg by mouth daily.    [provider]  hydrocortisone 2.5 % lotion Apply topically 2 (two) times daily. Left external ear 09/03/18   Arnaldo Natal, MD  ketoconazole (NIZORAL) 2 % shampoo Apply 1 application. topically daily. 11/05/21   [provider]  losartan-hydrochlorothiazide (HYZAAR) 100-12.5 MG per tablet Take 1 tablet by mouth daily.    [provider]  metoprolol succinate (TOPROL-XL) 25 MG 24 hr tablet Take 1 tablet (25 mg total) by mouth daily. 01/20/22   Minus Breeding, MD  Multiple Vitamin (MULTIVITAMIN) capsule Take 1 capsule by mouth daily.    [provider]  neomycin-polymyxin-hydrocortisone (CORTISPORIN) 3.5-10000-1 OTIC suspension Place 4 drops into the left ear 3 (three) times daily. 09/03/18   Arnaldo Natal, MD    Family History Family History  Problem Relation Age of Onset   Alzheimer's disease Mother        died at 47  Breast cancer Mother            Heart attack Father 79       died at 34   Crohn's disease Son     Social History Social History   Tobacco Use   Smoking status: Never   Smokeless tobacco: Never  Vaping Use   Vaping Use: Never used  Substance Use Topics   Alcohol use: Yes    Alcohol/week: 1.0 - 2.0 standard drink of alcohol    Types: 1 - 2 Standard drinks or equivalent per week    Comment: occasionally.    Drug use: No    Frequency: 2.0 times per week     Allergies   Ciprofloxacin, Macrodantin [nitrofurantoin macrocrystal], Metronidazole, Naproxen, and Vitamin  d   Review of Systems Review of Systems   Physical Exam Triage Vital Signs ED Triage Vitals  Enc Vitals Group     BP 05/12/22 0912 (!) 157/92     Pulse Rate 05/12/22 0912 61     Resp 05/12/22 0912 18     Temp 05/12/22 0912 98.2 F (36.8 C)     Temp Source 05/12/22 0912 Oral     SpO2 05/12/22 0912 96 %     Weight --      Height --      Head Circumference --      Peak Flow --      Pain Score 05/12/22 0913 0     Pain Loc --      Pain Edu? --      Excl. in [place]? --    No data found.  Updated Vital Signs BP (!) 157/92 (BP Location: Left Arm)   Pulse 61   Temp 98.2 F (36.8 C) (Oral)   Resp 18   SpO2 96%   Visual Acuity Right Eye Distance:   Left Eye Distance:   Bilateral Distance:    Right Eye Near:   Left Eye Near:    Bilateral Near:     Physical Exam Vitals reviewed.  Constitutional:      General: She is not in acute distress.    Appearance: She is not ill-appearing, toxic-appearing or diaphoretic.  HENT:     Mouth/Throat:     Mouth: Mucous membranes are moist.  Eyes:     Extraocular Movements: Extraocular movements intact.     Pupils: Pupils are equal, round, and reactive to light.  Cardiovascular:     Rate and Rhythm: Normal rate and regular rhythm.  Musculoskeletal:     Comments: There is some tenderness at her right buttock and in her right inguinal area.  There is no rash.  Also around her lower pole of the right gastrocnemius muscle there is a collection of varicose veins there that is a little tender and a little puffier than the surrounding tissue.  She does have scattered large varicosities on both legs.  There is no edema of either leg and no erythema of either leg.  There is no calf tenderness otherwise and Homans is negative  Skin:    Coloration: Skin is not jaundiced or pale.     Findings: No rash.  Neurological:     Mental Status: She is alert and oriented to person, place, and time.  Psychiatric:        Behavior: Behavior normal.       UC Treatments / Results  Labs (all labs ordered are listed, but only abnormal results are displayed) Labs Reviewed - No data to display  EKG   Radiology DG Pelvis 1-2 Views  Result Date: 05/12/2022 CLINICAL DATA:  Right buttock and hip pain EXAM: PELVIS - 1-2 VIEW COMPARISON:  CT 02/10/2022 FINDINGS: Mild osteoarthritis of both hip joints with mild joint space narrowing and marginal osteophytes. Symphysis pubis is normal. Mild osteoarthritis of the right sacroiliac joint. No other focal pelvic finding. 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 No acute finding. Electronically Signed   By: Nelson Chimes M.D.   On: 05/12/2022 10:06    Procedures Procedures (including critical care time)  Medications Ordered in UC Medications - No data to display  Initial Impression / Assessment and Plan / UC Course  I have reviewed the triage vital signs and the nursing notes.  Pertinent labs & imaging results that were available during my care of the patient were reviewed by me and considered in my medical decision making (see chart for details).     X-ray does show some mild arthritis of both hips with some narrowing of the joint space there. I am going to treat with 5 days of prednisone to help with inflammation in her hip joints and with the possible superficial thrombophlebitis she is has going on in her calf on the right.  She is given contact information for vein specialist, and she is going to follow-up with her primary care about both issues  Last renal function was mildly reduced   Final Clinical Impressions(s) / UC Diagnoses   Final diagnoses:  Right hip pain  Thrombophlebitis of superficial veins of right lower extremity     Discharge Instructions      Your x-ray did show some mild arthritis of both hips.  Continue to wear the your compressive wrap on your right lower leg  Take prednisone 20 mg--2 daily for 5 days.  You can take  Tylenol 500 mg--2 every 6 hours as needed for pain also  Follow-up with your primary care about these issues.     ED Prescriptions     Medication Sig Dispense Auth. Provider   predniSONE (DELTASONE) 20 MG tablet Take 2 tablets (40 mg total) by mouth daily with breakfast for 5 days. 10 tablet Windy Carina Gwenlyn Perking, MD      PDMP not reviewed this encounter.   Barrett Henle, MD 05/12/22 1016

## 2022-05-12 NOTE — ED Triage Notes (Signed)
Pt c/o pain to her right lower leg and right posterior hip. Hip pain is worse when leaning over.

## 2022-06-04 DIAGNOSIS — M25551 Pain in right hip: Secondary | ICD-10-CM | POA: Diagnosis not present

## 2022-06-04 DIAGNOSIS — H9193 Unspecified hearing loss, bilateral: Secondary | ICD-10-CM | POA: Diagnosis not present

## 2022-06-04 DIAGNOSIS — M5441 Lumbago with sciatica, right side: Secondary | ICD-10-CM | POA: Diagnosis not present

## 2022-06-05 ENCOUNTER — Other Ambulatory Visit (HOSPITAL_BASED_OUTPATIENT_CLINIC_OR_DEPARTMENT_OTHER): Payer: Self-pay | Admitting: Family Medicine

## 2022-06-05 DIAGNOSIS — M5441 Lumbago with sciatica, right side: Secondary | ICD-10-CM

## 2022-06-06 ENCOUNTER — Ambulatory Visit (HOSPITAL_BASED_OUTPATIENT_CLINIC_OR_DEPARTMENT_OTHER)
Admission: RE | Admit: 2022-06-06 | Discharge: 2022-06-06 | Disposition: A | Discharge status: Home / Self Care | Payer: Medicare Other | Source: Ambulatory Visit | Attending: Family Medicine | Admitting: Family Medicine

## 2022-06-06 DIAGNOSIS — M545 Low back pain, unspecified: Secondary | ICD-10-CM | POA: Diagnosis not present

## 2022-06-06 DIAGNOSIS — M5136 Other intervertebral disc degeneration, lumbar region: Secondary | ICD-10-CM | POA: Diagnosis not present

## 2022-06-06 DIAGNOSIS — M5441 Lumbago with sciatica, right side: Secondary | ICD-10-CM | POA: Insufficient documentation

## 2022-06-29 DIAGNOSIS — R350 Frequency of micturition: Secondary | ICD-10-CM | POA: Diagnosis not present

## 2022-06-29 DIAGNOSIS — N3000 Acute cystitis without hematuria: Secondary | ICD-10-CM | POA: Diagnosis not present

## 2022-08-07 DIAGNOSIS — H60542 Acute eczematoid otitis externa, left ear: Secondary | ICD-10-CM | POA: Diagnosis not present

## 2022-08-07 DIAGNOSIS — R350 Frequency of micturition: Secondary | ICD-10-CM | POA: Diagnosis not present

## 2022-08-09 DIAGNOSIS — N3001 Acute cystitis with hematuria: Secondary | ICD-10-CM | POA: Diagnosis not present

## 2022-08-09 DIAGNOSIS — N390 Urinary tract infection, site not specified: Secondary | ICD-10-CM | POA: Diagnosis not present

## 2022-09-01 DIAGNOSIS — R3915 Urgency of urination: Secondary | ICD-10-CM | POA: Diagnosis not present

## 2022-09-01 DIAGNOSIS — N390 Urinary tract infection, site not specified: Secondary | ICD-10-CM | POA: Diagnosis not present

## 2022-09-01 DIAGNOSIS — Z6836 Body mass index (BMI) 36.0-36.9, adult: Secondary | ICD-10-CM | POA: Diagnosis not present

## 2022-09-17 DIAGNOSIS — R399 Unspecified symptoms and signs involving the genitourinary system: Secondary | ICD-10-CM | POA: Diagnosis not present

## 2022-09-17 DIAGNOSIS — M25551 Pain in right hip: Secondary | ICD-10-CM | POA: Diagnosis not present

## 2022-09-17 DIAGNOSIS — G8929 Other chronic pain: Secondary | ICD-10-CM | POA: Diagnosis not present

## 2022-09-17 DIAGNOSIS — Z6836 Body mass index (BMI) 36.0-36.9, adult: Secondary | ICD-10-CM | POA: Diagnosis not present

## 2022-09-17 DIAGNOSIS — R300 Dysuria: Secondary | ICD-10-CM | POA: Diagnosis not present

## 2022-09-17 DIAGNOSIS — M25561 Pain in right knee: Secondary | ICD-10-CM | POA: Diagnosis not present

## 2022-09-24 DIAGNOSIS — G8929 Other chronic pain: Secondary | ICD-10-CM | POA: Diagnosis not present

## 2022-09-24 DIAGNOSIS — R300 Dysuria: Secondary | ICD-10-CM | POA: Diagnosis not present

## 2022-09-24 DIAGNOSIS — R399 Unspecified symptoms and signs involving the genitourinary system: Secondary | ICD-10-CM | POA: Diagnosis not present

## 2022-09-24 DIAGNOSIS — M25551 Pain in right hip: Secondary | ICD-10-CM | POA: Diagnosis not present

## 2022-09-24 DIAGNOSIS — M25561 Pain in right knee: Secondary | ICD-10-CM | POA: Diagnosis not present

## 2022-10-29 ENCOUNTER — Ambulatory Visit (INDEPENDENT_AMBULATORY_CARE_PROVIDER_SITE_OTHER): Payer: Medicare Other | Admitting: Obstetrics and Gynecology

## 2022-10-29 ENCOUNTER — Encounter: Payer: Self-pay | Admitting: Obstetrics and Gynecology

## 2022-10-29 VITALS — BP 126/79 | HR 67 | Ht 63.75 in | Wt 215.0 lb

## 2022-10-29 DIAGNOSIS — N3281 Overactive bladder: Secondary | ICD-10-CM | POA: Diagnosis not present

## 2022-10-29 DIAGNOSIS — N390 Urinary tract infection, site not specified: Secondary | ICD-10-CM | POA: Diagnosis not present

## 2022-10-29 DIAGNOSIS — N952 Postmenopausal atrophic vaginitis: Secondary | ICD-10-CM

## 2022-10-29 DIAGNOSIS — R82998 Other abnormal findings in urine: Secondary | ICD-10-CM

## 2022-10-29 DIAGNOSIS — R350 Frequency of micturition: Secondary | ICD-10-CM | POA: Diagnosis not present

## 2022-10-29 DIAGNOSIS — R159 Full incontinence of feces: Secondary | ICD-10-CM | POA: Diagnosis not present

## 2022-10-29 LAB — POCT URINALYSIS DIPSTICK
Bilirubin, UA: NEGATIVE
Glucose, UA: NEGATIVE
Nitrite, UA: NEGATIVE
Protein, UA: NEGATIVE
Spec Grav, UA: 1.025 (ref 1.010–1.025)
Urobilinogen, UA: 0.2 E.U./dL
pH, UA: 5.5 (ref 5.0–8.0)

## 2022-10-29 MED ORDER — ESTRADIOL 10 MCG VA TABS: 1.0000 | ORAL_TABLET | VAGINAL | 11 refills | Status: DC

## 2022-10-29 MED ORDER — SULFAMETHOXAZOLE-TRIMETHOPRIM 800-160 MG PO TABS: 1.0000 | ORAL_TABLET | Freq: Two times a day (BID) | ORAL | 0 refills | Status: DC

## 2022-10-29 MED ORDER — CEPHALEXIN 250 MG PO CAPS: 250.0000 mg | ORAL_CAPSULE | Freq: Every day | ORAL | 5 refills | Status: DC

## 2022-10-29 NOTE — Progress Notes (Signed)
[facility] New Patient Evaluation and Consultation  Referring Provider: Marda Stalker, PA-C PCP: Marda Stalker, PA-C Date of Service: 10/29/2022  SUBJECTIVE Chief Complaint: New Patient (Initial Visit) Melissa Lane is a 89 y.o. female here for a consult for dysuria.)  History of Present Illness: Melissa Lane is a 89 y.o. White or Caucasian female seen in consultation at the request of PA Marda Stalker for evaluation of urinary tract infections.    Review of records significant for: Has had multiple UTIs. Previously had seen urology and was on a 6 month course of trimethoprim.  Urine culture 08/2022- positive for klebsiella  Urinary Symptoms: Leaks urine with with movement to the bathroom Leaks occasionally Pad use: 1 liners/ mini-pads per day.   She is bothered by her UI symptoms.  Day time voids- sometimes can hold for a few hours, sometimes more often.  Nocturia: 1 times per night to void. Voiding dysfunction: she empties her bladder well.  does not use a catheter to empty bladder.  When urinating, she feels she has no difficulties   UTIs:  several  UTI's in the last year.   She reports that she has been to [facility] and was diagnosed with 2 urinary tract infections. Diagnosed with UTI at an urgent care last year. PCP treated with one antibiotic Urine culture 02/10/22 shows <10,000.  Has painful urination and urgency at baseline.  In 2022, she was on longer term antibiotics for 6 months.  She was prescribed vaginal estrogen but never used it because it was unclear to her why she used it.  Denies history of blood in urine and kidney or bladder stones  Pelvic Organ Prolapse Symptoms:                  She Denies a feeling of a bulge the vaginal area.   Bowel Symptom: Bowel movements: alternates between every other day and 6 time(s) per day Stool consistency: soft  or loose Straining: no.  Splinting: no.  Incomplete evacuation: no.  Admits  to accidental bowel leakage / fecal incontinence due to diverticulitis  Occurs: weekly  Consistency with leakage: liquid Bowel regimen: none Last colonoscopy: 2 years ago- showed diverticulitis  Sexual Function Sexually active: no.   Pelvic Pain Denies pelvic pain  Past Medical History:  Past Medical History:  Diagnosis Date   Arthritis    Coronary artery calcification seen on CAT scan 2016   Hyperlipidemia    Hypertension    Peri-rectal abscess 2013   SVT (supraventricular tachycardia) 05/2018     Past Surgical History:   Past Surgical History:  Procedure Laterality Date   ABDOMINAL HYSTERECTOMY  1978   Partial   BREAST BIOPSY Left 07/26/1999   CATARACT EXTRACTION     TOTAL SHOULDER ARTHROPLASTY Right 08/26/2018   Procedure: RIGHT TOTAL SHOULDER ARTHROPLASTY;  Surgeon: Tania Ade, MD;  Location: [facility];  Service: Orthopedics;  Laterality: Right;     Past OB/GYN History: OB History  Gravida Para Term Preterm AB Living  2         2  SAB IAB Ectopic Multiple Live Births          2    # Outcome Date GA Lbr Len/2nd Weight Sex Delivery Anes PTL Lv  2 Gravida           1 Gravida             Vaginal deliveries: 2,  Forceps/ Vacuum deliveries: 0, Cesarean section: 0 Menopausal:  Denies vaginal bleeding since menopause    Medications: She has a current medication list which includes the following prescription(s): aspirin ec, atorvastatin, cephalexin, [START ON 10/30/2022] estradiol, hydrocortisone, ketoconazole, losartan-hydrochlorothiazide, meloxicam, metoprolol succinate, multivitamin, neomycin-polymyxin-hydrocortisone, and sulfamethoxazole-trimethoprim.   Allergies: Patient is allergic to ciprofloxacin, macrodantin [nitrofurantoin macrocrystal], metronidazole, naproxen, and vitamin d (calciferol).   Social History:  Social History   Tobacco Use   Smoking status: Never   Smokeless tobacco: Never  Vaping Use   Vaping Use: Never used  Substance Use Topics    Alcohol use: Yes    Alcohol/week: 1.0 - 2.0 standard drink of alcohol    Types: 1 - 2 Standard drinks or equivalent per week    Comment: occasionally.    Drug use: No    Frequency: 2.0 times per week    Relationship status: widowed She lives alone She is not employed. Regular exercise: No History of abuse: No  Family History:   Family History  Problem Relation Age of Onset   Alzheimer's disease Mother        died at 68   Breast cancer Mother            Heart attack Father 18       died at 93   Crohn's disease Son      Review of Systems: Review of Systems  Constitutional:  Negative for fever, malaise/fatigue and weight loss.  Respiratory:  Negative for cough, shortness of breath and wheezing.   Cardiovascular:  Negative for chest pain, palpitations and leg swelling.  Gastrointestinal:  Negative for abdominal pain and blood in stool.  Genitourinary:  Positive for dysuria.  Musculoskeletal:  Negative for myalgias.  Skin:  Negative for rash.  Neurological:  Negative for dizziness and headaches.  Endo/Heme/Allergies:  Does not bruise/bleed easily.  Psychiatric/Behavioral:  Negative for depression. The patient is not nervous/anxious.      OBJECTIVE Physical Exam: Vitals:   10/29/22 1416  BP: 126/79  Pulse: 67  Weight: 215 lb (97.5 kg)  Height: 5' 3.75" (1.619 m)    Physical Exam Constitutional:      General: She is not in acute distress. Pulmonary:     Effort: Pulmonary effort is normal.  Abdominal:     General: There is no distension.     Palpations: Abdomen is soft.     Tenderness: There is no abdominal tenderness. There is no rebound.  Musculoskeletal:        General: No swelling. Normal range of motion.  Skin:    General: Skin is warm and dry.     Findings: No rash.  Neurological:     Mental Status: She is alert and oriented to person, place, and time.  Psychiatric:        Mood and Affect: Mood normal.        Behavior: Behavior normal.      GU /  Detailed Urogynecologic Evaluation:  Pelvic Exam: Normal external female genitalia; Bartholin's and Skene's glands normal in appearance; urethral meatus normal in appearance, no urethral masses or discharge.   CST: negative  s/p hysterectomy: Speculum exam reveals normal vaginal mucosa with  atrophy and normal vaginal cuff.  Adnexa no mass, fullness, tenderness.     Pelvic floor strength II/V  Pelvic floor musculature: Right levator non-tender, Right obturator non-tender, Left levator non-tender, Left obturator non-tender  POP-Q:   POP-Q  -2  Aa   -2                                           Ba  -6                                              C   2.5                                            Gh  5                                            Pb  7.5                                            tvl   -1                                            Ap  -1                                            Bp                                                 D      Rectal Exam:  Normal external rectum  Post-Void Residual (PVR) by Bladder Scan: In order to evaluate bladder emptying, we discussed obtaining a postvoid residual and she agreed to this procedure.  Procedure: The ultrasound unit was placed on the patient's abdomen in the suprapubic region after the patient had voided. A PVR of 55 ml was obtained by bladder scan.  Laboratory Results: POC urine: moderate leukocytes, trace blood   ASSESSMENT AND PLAN Ms. Mullens is a 89 y.o. with:  1. Urinary frequency   2. Leukocytes in urine   3. Recurrent urinary tract infection   4. Vaginal atrophy   5. Overactive bladder   6. Incontinence of feces, unspecified fecal incontinence type    Leukocytes in urine - Urine sent for culture. Will treat for UTI with bactrim DS BID x3 days.   2. Recurrent uti/ atrophy - For treatment of recurrent urinary tract infections, we discussed management  of recurrent UTIs including prophylaxis with a daily low dose antibiotic, transvaginal estrogen therapy, D-mannose, and cranberry supplements.  We discussed the role of diagnostic testing such as cystoscopy and upper tract imaging.   - Will start prophylaxis with keflex '250mg'$  daily x 6 months after she completes the course of bactrim.  - Also recommended vaginal estrogen. Feels the  cream is too messy. Ordered vaginal estrogen tablets (yuvafem) to place in vagina twice a week.  - If she has UTI symptoms, should call our office or PCP to give urine sample for culture  3. OAB - We discussed the symptoms of overactive bladder (OAB), which include urinary urgency, urinary frequency, nocturia, with or without urge incontinence.  While we do not know the exact etiology of OAB, several treatment options exist. We discussed management including behavioral therapy (decreasing bladder irritants, urge suppression strategies, timed voids, bladder retraining), physical therapy, medication.  - She is not interested in treatment right now. Her main concern is controlling UTIs then will reevaluate symptoms.   4. Accidental Bowel Leakage:  - Treatment options include anti-diarrhea medication (loperamide/ Imodium OTC or prescription lomotil), fiber supplements, physical therapy, and possible sacral neuromodulation or surgery.   - Recommended starting with daily psyllium fiber supplements for stool bulking and imodium if needed  Return 6 months or sooner if needed  Jaquita Folds, MD

## 2022-10-29 NOTE — Patient Instructions (Addendum)
Accidental Bowel Leakage: Our goal is to achieve formed bowel movements daily or every-other-day without leakage.  You may need to try different combinations of the following options to find what works best for you.  Some management options include: Dietary changes (more leafy greens, vegetables and fruits; less processed foods) Fiber supplementation (Metamucil or something with psyllium as active ingredient) Over-the-counter imodium (tablets or liquid) to help solidify the stool and prevent leakage of stool.  Your urine is suspicious for urinary tract infection. We have prescribed bactrim to take twice a day for 3 days.  A urine culture has been sent and antibiotics may change depending on results.   Then start keflex '250mg'$  daily to help prevent urinary tract infections. Also prescribed vaginal estrogen tablets- place this in the vagina twice a week to help prevent infections.   Follow up in 6 months or sooner if needed. If you have symptoms of a UTI, call our office so you can give a urine sample for testing.

## 2022-10-30 ENCOUNTER — Encounter: Payer: Self-pay | Admitting: Obstetrics and Gynecology

## 2022-10-30 ENCOUNTER — Ambulatory Visit (INDEPENDENT_AMBULATORY_CARE_PROVIDER_SITE_OTHER): Payer: Medicare Other | Admitting: Obstetrics and Gynecology

## 2022-10-30 VITALS — BP 105/68 | HR 66

## 2022-10-30 DIAGNOSIS — L270 Generalized skin eruption due to drugs and medicaments taken internally: Secondary | ICD-10-CM

## 2022-10-30 DIAGNOSIS — T7840XA Allergy, unspecified, initial encounter: Secondary | ICD-10-CM

## 2022-10-30 MED ORDER — DIPHENHYDRAMINE HCL 50 MG PO TABS: 50.0000 mg | ORAL_TABLET | Freq: Four times a day (QID) | ORAL | 0 refills | Status: DC | PRN

## 2022-10-30 MED ORDER — PREDNISONE 10 MG PO TABS: 10.0000 mg | ORAL_TABLET | Freq: Every day | ORAL | 0 refills | Status: DC

## 2022-10-30 MED ORDER — PANTOPRAZOLE SODIUM 40 MG PO TBEC: 40.0000 mg | DELAYED_RELEASE_TABLET | Freq: Every day | ORAL | 0 refills | Status: DC

## 2022-10-30 NOTE — Patient Instructions (Addendum)
Take Benadryl '50mg'$  every 6 hours as needed  Take Protonix '40mg'$  once daily for the next 5 days Take Prednisone '10mg'$  daily with breakfast for the next 5 days.    Stop the Bactrim, will add this to your allergies  Do not take any antibiotics until the rash has cleared up. Will await results of your culture and send you a MyChart message with results. Please send a message if the rash is not fading by tomorrow.

## 2022-10-30 NOTE — Progress Notes (Signed)
 Urogynecology Return Visit  SUBJECTIVE  History of Present Illness: Melissa Lane is a 89 y.o. female seen in follow-up for rash to bactrim. Plan at last visit was start bactrim for UTI.   Patient presents with rash over her upper arms and trunk.   Reports she had a dose last night and a dose this morning and she noticed the rash after that.   Denies facial swelling or trouble breathing.   Past Medical History: Patient  has a past medical history of Arthritis, Coronary artery calcification seen on CAT scan (2016), Hyperlipidemia, Hypertension, Peri-rectal abscess (2013), and SVT (supraventricular tachycardia) (05/2018).   Past Surgical History: She  has a past surgical history that includes Abdominal hysterectomy (1978); Cataract extraction; Breast biopsy (Left, 07/26/1999); and Total shoulder arthroplasty (Right, 08/26/2018).   Medications: She has a current medication list which includes the following prescription(s): diphenhydramine, pantoprazole, prednisone, aspirin ec, atorvastatin, cephalexin, estradiol, hydrocortisone, ketoconazole, losartan-hydrochlorothiazide, meloxicam, metoprolol succinate, multivitamin, and neomycin-polymyxin-hydrocortisone.   Allergies: Patient is allergic to ciprofloxacin, macrodantin [nitrofurantoin macrocrystal], metronidazole, naproxen, vitamin d (calciferol), and bactrim [sulfamethoxazole-trimethoprim].   Social History: Patient  reports that she has never smoked. She has never used smokeless tobacco. She reports current alcohol use of about 1.0 - 2.0 standard drink of alcohol per week. She reports that she does not use drugs.      OBJECTIVE     Physical Exam: Vitals:   10/30/22 1313  BP: 105/68  Pulse: 66   Gen: No apparent distress, A&O x 3. Skin: Rash noted to chest, upper arms, and back Resp: Talking in complete sentences, no coughing, gagging or noted difficulty speaking HEENT: No tongue or lip swelling or difficulty  swallowing Detailed Urogynecologic Evaluation:  Deferred.     ASSESSMENT AND PLAN    [name] is a 89 y.o. with:  1. Allergic reaction to drug, initial encounter     Bactrim added to patient's allergies after reaction.  Told patient to take '50mg'$  of Benadryl every 6 hours as needed, take '40mg'$  of protonix daily for the next 5 days, and take '10mg'$  of prednisone with breakfast daily for 5 days to get rid of rash. She is not to take any more bactrim and to wait for culture to come back for [place] to treat her for UTI and then she can start the Keflex for UTI prevention.   Patient has a planned follow up to come back in August, she may come sooner if needed.

## 2022-11-02 ENCOUNTER — Encounter: Payer: Self-pay | Admitting: Obstetrics and Gynecology

## 2022-11-02 DIAGNOSIS — R82998 Other abnormal findings in urine: Secondary | ICD-10-CM

## 2022-11-03 MED ORDER — FOSFOMYCIN TROMETHAMINE 3 G PO PACK: 3.0000 g | PACK | Freq: Once | ORAL | 0 refills | Status: DC

## 2022-11-05 MED ORDER — FOSFOMYCIN TROMETHAMINE 3 G PO PACK: 3.0000 g | PACK | Freq: Once | ORAL | 0 refills | Status: AC

## 2022-11-05 NOTE — Addendum Note (Signed)
Addended by: Berton Mount on: 11/05/2022 08:58 AM   Modules accepted: Orders

## 2022-11-10 ENCOUNTER — Encounter: Payer: Self-pay | Admitting: *Deleted

## 2022-11-12 DIAGNOSIS — H903 Sensorineural hearing loss, bilateral: Secondary | ICD-10-CM | POA: Diagnosis not present

## 2022-11-28 ENCOUNTER — Ambulatory Visit: Payer: Medicare Other | Admitting: Obstetrics and Gynecology

## 2022-12-03 DIAGNOSIS — Z461 Encounter for fitting and adjustment of hearing aid: Secondary | ICD-10-CM | POA: Diagnosis not present

## 2023-01-09 ENCOUNTER — Other Ambulatory Visit: Payer: Self-pay | Admitting: Family Medicine

## 2023-01-09 DIAGNOSIS — Z1231 Encounter for screening mammogram for malignant neoplasm of breast: Secondary | ICD-10-CM

## 2023-01-15 DIAGNOSIS — Z01118 Encounter for examination of ears and hearing with other abnormal findings: Secondary | ICD-10-CM | POA: Diagnosis not present

## 2023-01-15 DIAGNOSIS — H6121 Impacted cerumen, right ear: Secondary | ICD-10-CM | POA: Diagnosis not present

## 2023-01-15 DIAGNOSIS — H938X1 Other specified disorders of right ear: Secondary | ICD-10-CM | POA: Diagnosis not present

## 2023-02-03 DIAGNOSIS — Z1389 Encounter for screening for other disorder: Secondary | ICD-10-CM | POA: Diagnosis not present

## 2023-02-03 DIAGNOSIS — Z23 Encounter for immunization: Secondary | ICD-10-CM | POA: Diagnosis not present

## 2023-02-03 DIAGNOSIS — Z0000 Encounter for general adult medical examination without abnormal findings: Secondary | ICD-10-CM | POA: Diagnosis not present

## 2023-02-03 DIAGNOSIS — E785 Hyperlipidemia, unspecified: Secondary | ICD-10-CM | POA: Diagnosis not present

## 2023-02-03 DIAGNOSIS — R7301 Impaired fasting glucose: Secondary | ICD-10-CM | POA: Diagnosis not present

## 2023-02-03 DIAGNOSIS — I10 Essential (primary) hypertension: Secondary | ICD-10-CM | POA: Diagnosis not present

## 2023-02-12 ENCOUNTER — Ambulatory Visit
Admission: RE | Admit: 2023-02-12 | Discharge: 2023-02-12 | Disposition: A | Discharge status: Home / Self Care | Payer: Medicare Other | Source: Ambulatory Visit | Attending: Family Medicine | Admitting: Family Medicine

## 2023-02-12 DIAGNOSIS — Z1231 Encounter for screening mammogram for malignant neoplasm of breast: Secondary | ICD-10-CM

## 2023-02-16 ENCOUNTER — Other Ambulatory Visit: Payer: Self-pay | Admitting: Cardiology

## 2023-03-02 ENCOUNTER — Encounter: Payer: Self-pay | Admitting: Obstetrics and Gynecology

## 2023-03-02 ENCOUNTER — Other Ambulatory Visit (HOSPITAL_COMMUNITY)
Admission: RE | Admit: 2023-03-02 | Discharge: 2023-03-02 | Disposition: A | Discharge status: Home / Self Care | Payer: Medicare Other | Source: Other Acute Inpatient Hospital | Attending: Obstetrics and Gynecology | Admitting: Obstetrics and Gynecology

## 2023-03-02 ENCOUNTER — Ambulatory Visit: Payer: Medicare Other

## 2023-03-02 DIAGNOSIS — R350 Frequency of micturition: Secondary | ICD-10-CM | POA: Diagnosis not present

## 2023-03-02 DIAGNOSIS — R82998 Other abnormal findings in urine: Secondary | ICD-10-CM | POA: Diagnosis not present

## 2023-03-02 LAB — POCT URINALYSIS DIPSTICK
Bilirubin, UA: NEGATIVE
Glucose, UA: NEGATIVE
Ketones, UA: NEGATIVE
Nitrite, UA: NEGATIVE
Protein, UA: NEGATIVE
Spec Grav, UA: >=1.03 — AB (ref 1.010–1.025)
Urobilinogen, UA: 0.2 E.U./dL
pH, UA: 5.5 (ref 5.0–8.0)

## 2023-03-02 NOTE — Patient Instructions (Signed)
Your Urine dip that was done in office was Positive. I am sending the urine off for culture and you can take AZO over the counter for your discomfort.  We will contact you when the results are back between 3-5 days. If an antibiotic is needed we will sent the order to the pharmacy and you will be notified. If you have any questions or concerns please feel free to call us at [phone]

## 2023-03-02 NOTE — Progress Notes (Signed)
Melissa Lane is a 89 y.o. female arrived today with UTI sx.  A urine specimen was collected and POCT Urine was done and urine culture sent to the lab. POCT Urine was Positive

## 2023-03-03 ENCOUNTER — Ambulatory Visit: Payer: Medicare Other

## 2023-03-03 MED ORDER — FOSFOMYCIN TROMETHAMINE 3 G PO PACK: 3.0000 g | PACK | Freq: Once | ORAL | 0 refills | Status: AC

## 2023-03-06 LAB — URINE CULTURE: Culture: 50000 — AB

## 2023-03-09 DIAGNOSIS — H9193 Unspecified hearing loss, bilateral: Secondary | ICD-10-CM | POA: Diagnosis not present

## 2023-03-09 DIAGNOSIS — Z45321 Encounter for adjustment and management of cochlear device: Secondary | ICD-10-CM | POA: Diagnosis not present

## 2023-03-09 DIAGNOSIS — Z974 Presence of external hearing-aid: Secondary | ICD-10-CM | POA: Diagnosis not present

## 2023-04-14 DIAGNOSIS — H903 Sensorineural hearing loss, bilateral: Secondary | ICD-10-CM | POA: Diagnosis not present

## 2023-04-24 DIAGNOSIS — Z79899 Other long term (current) drug therapy: Secondary | ICD-10-CM | POA: Diagnosis not present

## 2023-04-30 ENCOUNTER — Encounter: Payer: Self-pay | Admitting: Obstetrics and Gynecology

## 2023-04-30 ENCOUNTER — Ambulatory Visit: Payer: Medicare Other | Admitting: Obstetrics and Gynecology

## 2023-05-05 ENCOUNTER — Ambulatory Visit (INDEPENDENT_AMBULATORY_CARE_PROVIDER_SITE_OTHER): Payer: Medicare Other | Admitting: Obstetrics and Gynecology

## 2023-05-05 ENCOUNTER — Encounter: Payer: Self-pay | Admitting: Obstetrics and Gynecology

## 2023-05-05 VITALS — BP 128/68 | HR 58

## 2023-05-05 DIAGNOSIS — N390 Urinary tract infection, site not specified: Secondary | ICD-10-CM

## 2023-05-05 DIAGNOSIS — N952 Postmenopausal atrophic vaginitis: Secondary | ICD-10-CM | POA: Diagnosis not present

## 2023-05-05 DIAGNOSIS — R350 Frequency of micturition: Secondary | ICD-10-CM | POA: Diagnosis not present

## 2023-05-05 DIAGNOSIS — K591 Functional diarrhea: Secondary | ICD-10-CM

## 2023-05-05 LAB — POCT URINALYSIS DIPSTICK
Bilirubin, UA: NEGATIVE
Blood, UA: NEGATIVE
Glucose, UA: NEGATIVE
Ketones, UA: NEGATIVE
Nitrite, UA: NEGATIVE
Protein, UA: NEGATIVE
Spec Grav, UA: 1.015 (ref 1.010–1.025)
Urobilinogen, UA: 0.2 E.U./dL
pH, UA: 6 (ref 5.0–8.0)

## 2023-05-05 MED ORDER — ESTRADIOL 7.5 MCG/24HR VA RING: 1.0000 | VAGINAL_RING | VAGINAL | 3 refills | Status: DC

## 2023-05-05 MED ORDER — METHENAMINE HIPPURATE 1 G PO TABS: 1.0000 g | ORAL_TABLET | Freq: Two times a day (BID) | ORAL | 2 refills | Status: DC

## 2023-05-05 NOTE — Patient Instructions (Addendum)
Finish the Keflex you were taking  Then start Hiprex twice a daily with meals  Let us know if you have further UTI Symptoms.   For the bowels; If you have loose stools I typically suggest using Metamucil for stool bulking and if you are having constipation I tend to suggest Miralax.   I will fax your urine results to [facility] on [address].

## 2023-05-05 NOTE — Progress Notes (Signed)
[facility] Return Visit  SUBJECTIVE  History of Present Illness: Melissa Lane is a 89 y.o. female seen in follow-up for rUTI. Plan at last visit was start Keflex for UTI prevention.   She has had one breakthrough UTI that was treated with Fosfomycin. She has not been doing vaginal cream or suppository as she reports her arthritis prevents her from being able to insert the cream or tablet.   Past Medical History: Patient  has a past medical history of Arthritis, Coronary artery calcification seen on CAT scan (2016), Hyperlipidemia, Hypertension, Peri-rectal abscess (2013), and SVT (supraventricular tachycardia) (05/2018).   Past Surgical History: She  has a past surgical history that includes Abdominal hysterectomy (1978); Cataract extraction; Breast biopsy (Left, 07/26/1999); and Total shoulder arthroplasty (Right, 08/26/2018).   Medications: She has a current medication list which includes the following prescription(s): aspirin ec, atorvastatin, cephalexin, estradiol, hydrocortisone, ketoconazole, losartan-hydrochlorothiazide, meloxicam, methenamine, metoprolol succinate, neomycin-polymyxin-hydrocortisone, diphenhydramine, multivitamin, pantoprazole, and prednisone.   Allergies: Patient is allergic to ciprofloxacin, macrodantin [nitrofurantoin macrocrystal], metronidazole, naproxen, vitamin d (calciferol), and bactrim [sulfamethoxazole-trimethoprim].   Social History: Patient  reports that she has never smoked. She has never used smokeless tobacco. She reports current alcohol use of about 1.0 - 2.0 standard drink of alcohol per week. She reports that she does not use drugs.      OBJECTIVE   POC Urine: Positive for trace leukocytes, negative for all other signs of infection.   Physical Exam: Vitals:   05/05/23 1019  BP: 128/68  Pulse: (!) 58   Gen: No apparent distress, A&O x 3.  Detailed Urogynecologic Evaluation:  Deferred.    ASSESSMENT AND PLAN    Melissa Lane  is a 89 y.o. with:  1. Recurrent UTI   2. Urinary frequency   3. Vaginal atrophy   4. Functional diarrhea    Patient to finish her Keflex and then we will switch to Hiprex 1g x2 daily for prevention of UTI.  She reports this is not bothersome and has been well controlled with diet and lifestyle changes.  Patient would be a good candidate for E-string use. Will plan to use an E-string as long as insurance will cover it. As topical vaginal estrogen is first line of defense against rUTI, this is important for functional protection of urethra against skin pathogens.  Patient has a problem with functional diarrhea and then constipation. We discussed using Metamucil in times of loose stools and then Miralax for constipation, or combination therapy. She reports she is in a good period at this time.   Patient to Return in 3 months for medication follow up.

## 2023-05-11 DIAGNOSIS — D492 Neoplasm of unspecified behavior of bone, soft tissue, and skin: Secondary | ICD-10-CM | POA: Diagnosis not present

## 2023-05-11 DIAGNOSIS — Z961 Presence of intraocular lens: Secondary | ICD-10-CM | POA: Diagnosis not present

## 2023-05-11 DIAGNOSIS — H5203 Hypermetropia, bilateral: Secondary | ICD-10-CM | POA: Diagnosis not present

## 2023-05-11 DIAGNOSIS — H524 Presbyopia: Secondary | ICD-10-CM | POA: Diagnosis not present

## 2023-05-17 ENCOUNTER — Other Ambulatory Visit: Payer: Self-pay | Admitting: Cardiology

## 2023-05-24 NOTE — Progress Notes (Unsigned)
  Cardiology Office Note:   Date:  05/27/2023  ID:  Melissa Lane, DOB Sep 08, 1934, MRN [id number] PCP: Jarrett Soho, PA-C  [facility] Providers Cardiologist:  Rollene Rotunda, MD {  History of Present Illness:   Melissa Lane is a 89 y.o. female who presents for follow up of coronary calcium.  Since I last saw her she has done well. The patient denies any new symptoms such as chest discomfort, neck or arm discomfort. There has been no new shortness of breath, PND or orthopnea. There have been no reported palpitations, presyncope or syncope.   She does feel her palpitations that she had previously only at night.  ROS: As stated in the HPI and negative for all other systems.  Studies Reviewed:    EKG:   EKG Interpretation Date/Time:  Wednesday May 27 2023 08:52:15 EDT Ventricular Rate:  52 PR Interval:  156 QRS Duration:  90 QT Interval:  444 QTC Calculation: 412 R Axis:   17  Text Interpretation: Sinus bradycardia Poor anterior R wave progression No previous ECGs available Confirmed by Rollene Rotunda ([id number]) on 05/27/2023 9:33:39 AM    Risk Assessment/Calculations:              Physical Exam:   VS:  BP 128/76 (BP Location: Left Arm, Patient Position: Sitting, Cuff Size: Normal)   Pulse (!) 52   Ht 5\' 3"  (1.6 m)   Wt 215 lb 6.4 oz (97.7 kg)   SpO2 97%   BMI 38.16 kg/m    Wt Readings from Last 3 Encounters:  05/27/23 215 lb 6.4 oz (97.7 kg)  10/29/22 215 lb (97.5 kg)  02/10/22 217 lb (98.4 kg)     GEN: Well nourished, well developed in no acute distress NECK: No JVD; No carotid bruits CARDIAC: RRR, no murmurs, rubs, gallops RESPIRATORY:  Clear to auscultation without rales, wheezing or rhonchi  ABDOMEN: Soft, non-tender, non-distended EXTREMITIES:  No edema; No deformity   ASSESSMENT AND PLAN:   ELEVATED CORONARY CALCIUM:   The patient has no new sypmtoms.  No further cardiovascular testing is indicated.  We will continue with aggressive risk reduction  and meds as listed.  PALPITATIONS:   These are optically problematic.  No change in therapy.   RISK REDUCTION: Her LDL was 83 with an HDL of 48.  We talked about dietary changes.  I do not think she needs a change in her medications at this point.       Follow up with me in one year.   Signed, Rollene Rotunda, MD

## 2023-05-27 ENCOUNTER — Encounter: Payer: Self-pay | Admitting: Cardiology

## 2023-05-27 ENCOUNTER — Ambulatory Visit: Payer: Medicare Other | Attending: Cardiology | Admitting: Cardiology

## 2023-05-27 VITALS — BP 128/76 | HR 52 | Ht 63.0 in | Wt 215.4 lb

## 2023-05-27 DIAGNOSIS — R931 Abnormal findings on diagnostic imaging of heart and coronary circulation: Secondary | ICD-10-CM | POA: Insufficient documentation

## 2023-05-27 DIAGNOSIS — E785 Hyperlipidemia, unspecified: Secondary | ICD-10-CM | POA: Insufficient documentation

## 2023-05-27 DIAGNOSIS — R002 Palpitations: Secondary | ICD-10-CM | POA: Insufficient documentation

## 2023-05-27 NOTE — Patient Instructions (Signed)
Medication Instructions:  Your physician recommends that you continue on your current medications as directed. Please refer to the Current Medication list given to you today.   *If you need a refill on your cardiac medications before your next appointment, please call your pharmacy*     Follow-Up: At [facility], you and your health needs are our priority.  As part of our continuing mission to provide you with exceptional heart care, we have created designated Provider Care Teams.  These Care Teams include your primary Cardiologist (physician) and Advanced Practice Providers (APPs -  Physician Assistants and Nurse Practitioners) who all work together to provide you with the care you need, when you need it.  We recommend signing up for the patient portal called "MyChart".  Sign up information is provided on this After Visit Summary.  MyChart is used to connect with patients for Virtual Visits (Telemedicine).  Patients are able to view lab/test results, encounter notes, upcoming appointments, etc.  Non-urgent messages can be sent to your provider as well.   To learn more about what you can do with MyChart, go to [email or web address].    Your next appointment:   1 year(s)  The format for your next appointment:   In Person  Provider:   Rollene Rotunda, MD

## 2023-05-28 DIAGNOSIS — L570 Actinic keratosis: Secondary | ICD-10-CM | POA: Diagnosis not present

## 2023-05-28 DIAGNOSIS — L821 Other seborrheic keratosis: Secondary | ICD-10-CM | POA: Diagnosis not present

## 2023-05-28 DIAGNOSIS — L218 Other seborrheic dermatitis: Secondary | ICD-10-CM | POA: Diagnosis not present

## 2023-06-13 ENCOUNTER — Other Ambulatory Visit: Payer: Self-pay | Admitting: Cardiology

## 2023-07-10 ENCOUNTER — Other Ambulatory Visit: Payer: Self-pay | Admitting: Cardiology

## 2023-08-12 ENCOUNTER — Encounter: Payer: Self-pay | Admitting: Obstetrics and Gynecology

## 2023-08-12 ENCOUNTER — Ambulatory Visit (INDEPENDENT_AMBULATORY_CARE_PROVIDER_SITE_OTHER): Payer: Medicare Other | Admitting: Obstetrics and Gynecology

## 2023-08-12 VITALS — BP 106/71 | HR 79

## 2023-08-12 DIAGNOSIS — N952 Postmenopausal atrophic vaginitis: Secondary | ICD-10-CM

## 2023-08-12 DIAGNOSIS — N816 Rectocele: Secondary | ICD-10-CM | POA: Diagnosis not present

## 2023-08-12 DIAGNOSIS — R350 Frequency of micturition: Secondary | ICD-10-CM | POA: Diagnosis not present

## 2023-08-12 DIAGNOSIS — Z87440 Personal history of urinary (tract) infections: Secondary | ICD-10-CM

## 2023-08-12 DIAGNOSIS — N390 Urinary tract infection, site not specified: Secondary | ICD-10-CM

## 2023-08-12 MED ORDER — METHENAMINE HIPPURATE 1 G PO TABS: 1.0000 g | ORAL_TABLET | Freq: Two times a day (BID) | ORAL | 2 refills | Status: DC

## 2023-08-12 NOTE — Patient Instructions (Signed)
Continue the methenamine twice daily for UTI prevention  Use a small stool in your bathroom to assist in your bowel movements  Please try to take deep breaths and blow out like you are blowing through a straw to help your bowels.

## 2023-08-13 NOTE — Progress Notes (Signed)
[facility] Return Visit  SUBJECTIVE  History of Present Illness: Melissa Lane is a 89 y.o. female seen in follow-up for rUTI. Plan at last visit was start Hiprex 1g x2 daily. Patient reports she has only been taking it once a day at times as it is a large pill but that she has been doing well and has not had any breakthrough UTI's in the last 3 months.      Past Medical History: Patient  has a past medical history of Arthritis, Coronary artery calcification seen on CAT scan (2016), Hyperlipidemia, Hypertension, Peri-rectal abscess (2013), and SVT (supraventricular tachycardia) (HCC) (05/2018).   Past Surgical History: She  has a past surgical history that includes Abdominal hysterectomy (1978); Cataract extraction; Breast biopsy (Left, 07/26/1999); and Total shoulder arthroplasty (Right, 08/26/2018).   Medications: She has a current medication list which includes the following prescription(s): aspirin ec, atorvastatin, hydrocortisone, losartan-hydrochlorothiazide, meloxicam, metoprolol succinate, neomycin-polymyxin-hydrocortisone, and methenamine.   Allergies: Patient is allergic to ciprofloxacin, macrodantin [nitrofurantoin macrocrystal], metronidazole, naproxen, vitamin d (calciferol), and bactrim [sulfamethoxazole-trimethoprim].   Social History: Patient  reports that she has never smoked. She has never used smokeless tobacco. She reports current alcohol use of about 1.0 - 2.0 standard drink of alcohol per week. She reports that she does not use drugs.      OBJECTIVE     Physical Exam: Vitals:   08/12/23 1106  BP: 106/71  Pulse: 79   Gen: No apparent distress, A&O x 3.  Detailed Urogynecologic Evaluation:  Deferred.    ASSESSMENT AND PLAN    Melissa Lane is a 89 y.o. with:  1. Recurrent UTI   2. Urinary frequency   3. Vaginal atrophy   4. Posterior vaginal wall prolapse    Patient is planning to continue on the Methenamine as she reports it has been  working well. Overall she is happy with where she is and wants to continue.  Patient reports no recent UTI's.  Patient reports her vaginal atrophy has not been bothersome. She does not feel the need to go forward with an E-string at this time.  We discussed what posterior vaginal wall prolapse means and how she can manage her symptoms, including using a squatty potty and focusing on breathing/reverse kegels during a bowel movement. The POP-Q tool was used to discuss her pelvic floor defects. She reported understanding.   Patient to follow up in 6 months or sooner if needed.  Recurrent UTI -     Methenamine Hippurate; Take 1 tablet (1 g total) by mouth 2 (two) times daily with a meal.  Dispense: 180 tablet; Refill: 2  Urinary frequency -     Methenamine Hippurate; Take 1 tablet (1 g total) by mouth 2 (two) times daily with a meal.  Dispense: 180 tablet; Refill: 2  Vaginal atrophy  Posterior vaginal wall prolapse     Selmer Dominion, NP

## 2023-10-27 ENCOUNTER — Ambulatory Visit (INDEPENDENT_AMBULATORY_CARE_PROVIDER_SITE_OTHER): Payer: Medicare Other

## 2023-10-27 ENCOUNTER — Other Ambulatory Visit (HOSPITAL_COMMUNITY)
Admission: RE | Admit: 2023-10-27 | Discharge: 2023-10-27 | Disposition: A | Discharge status: Home / Self Care | Payer: Medicare Other | Source: Ambulatory Visit | Attending: Obstetrics and Gynecology | Admitting: Obstetrics and Gynecology

## 2023-10-27 ENCOUNTER — Other Ambulatory Visit: Payer: Self-pay | Admitting: Obstetrics and Gynecology

## 2023-10-27 DIAGNOSIS — N390 Urinary tract infection, site not specified: Secondary | ICD-10-CM | POA: Diagnosis not present

## 2023-10-27 DIAGNOSIS — R82998 Other abnormal findings in urine: Secondary | ICD-10-CM | POA: Insufficient documentation

## 2023-10-27 DIAGNOSIS — R350 Frequency of micturition: Secondary | ICD-10-CM

## 2023-10-27 LAB — POCT URINALYSIS DIPSTICK
Bilirubin, UA: NEGATIVE
Blood, UA: NEGATIVE
Glucose, UA: NEGATIVE
Ketones, UA: NEGATIVE
Nitrite, UA: NEGATIVE
Protein, UA: NEGATIVE
Spec Grav, UA: 1.015 (ref 1.010–1.025)
Urobilinogen, UA: 0.2 U/dL
pH, UA: 5.5 (ref 5.0–8.0)

## 2023-10-27 MED ORDER — CEPHALEXIN 500 MG PO CAPS: 500.0000 mg | ORAL_CAPSULE | Freq: Two times a day (BID) | ORAL | 0 refills | Status: DC

## 2023-10-27 NOTE — Patient Instructions (Signed)
Your Urine dip that was done in office was Positive. I am sending the urine off for culture and you can take AZO over the counter for your discomfort.  We will contact you when the results are back between 3-5 days. If a antibiotic is needed we will sent the order to the pharmacy and you will be notified. If you have any questions or concerns please feel free to call us at [phone]

## 2023-10-27 NOTE — Progress Notes (Signed)
Melissa Lane is a 89 y.o. female arrived today with UTI sx.  Per Dr. Jari Favre protocol: A urine specimen was collected and POCT Urine was done and urine culture sent to the lab. POCT Urine was Positive.

## 2023-10-28 ENCOUNTER — Ambulatory Visit (HOSPITAL_COMMUNITY)
Admission: EM | Admit: 2023-10-28 | Discharge: 2023-10-28 | Disposition: A | Discharge status: Home / Self Care | Payer: Medicare Other

## 2023-10-28 ENCOUNTER — Encounter (HOSPITAL_COMMUNITY): Payer: Self-pay

## 2023-10-28 ENCOUNTER — Telehealth (HOSPITAL_COMMUNITY): Payer: Self-pay

## 2023-10-28 ENCOUNTER — Ambulatory Visit (HOSPITAL_BASED_OUTPATIENT_CLINIC_OR_DEPARTMENT_OTHER)
Admission: RE | Admit: 2023-10-28 | Discharge: 2023-10-28 | Disposition: A | Discharge status: Home / Self Care | Payer: Medicare Other | Source: Ambulatory Visit | Attending: Cardiology | Admitting: Cardiology

## 2023-10-28 DIAGNOSIS — R229 Localized swelling, mass and lump, unspecified: Secondary | ICD-10-CM | POA: Insufficient documentation

## 2023-10-28 DIAGNOSIS — M7989 Other specified soft tissue disorders: Secondary | ICD-10-CM

## 2023-10-28 DIAGNOSIS — I83891 Varicose veins of right lower extremities with other complications: Secondary | ICD-10-CM | POA: Diagnosis not present

## 2023-10-28 DIAGNOSIS — M79604 Pain in right leg: Secondary | ICD-10-CM | POA: Insufficient documentation

## 2023-10-28 MED ORDER — BACLOFEN 10 MG PO TABS: 10.0000 mg | ORAL_TABLET | Freq: Three times a day (TID) | ORAL | 0 refills | Status: DC

## 2023-10-28 NOTE — Discharge Instructions (Addendum)
 We have ordered a venous study for you today.  Please go to this appointment to have imaging done to rule out blood clot.  I have prescribed baclofen  which you can take up to 3 times daily as needed for your pain.  This can make you drowsy so do not drive while taking.  Otherwise alternate between naproxen and Tylenol  as needed for pain.  You can also alternate between heat and ice for pain.  If pain persists follow-up with primary care provider for further evaluation.

## 2023-10-28 NOTE — Telephone Encounter (Signed)
 Called the the patient's daughter and she verified the pt with 3 pt identifiers. The provider asked me inform them that her mother's DVT study showed enlarged varicose veins, but nothing to be concerned about. I informed her to contact her PCP. Pt's daughter verbalized understanding.

## 2023-10-28 NOTE — ED Notes (Signed)
 Venous doppler schedule for today

## 2023-10-28 NOTE — ED Provider Notes (Signed)
 [facility]    CSN: [id number] Arrival date & time: 10/28/23  0807      History   Chief Complaint Chief Complaint  Patient presents with   Knot on Calf   Joint Pain    HPI Melissa Lane is a 89 y.o. female.   Patient presents with right hip, calf, and heel pain that began 5 days ago.  Patient describes pain as burning.  Patient reports noticing a knot on her right calf that was about the size of an egg yesterday.  Patient states that the knot is smaller today.  Patient reports difficulty walking this morning due to pain but applied Voltaren with relief of pain.     Past Medical History:  Diagnosis Date   Arthritis    Coronary artery calcification seen on CAT scan 2016   Hyperlipidemia    Hypertension    Peri-rectal abscess 2013   SVT (supraventricular tachycardia) (HCC) 05/2018    Patient Active Problem List   Diagnosis Date Noted   Precordial chest pain 10/05/2019   Palpitations 10/05/2019   Educated about COVID-19 virus infection 10/05/2019   S/P shoulder replacement, right 08/26/2018   Allergic rhinitis 06/03/2016   Essential hypertension, benign 06/03/2016   Impaired fasting glucose 06/03/2016   Peripheral neuropathy 06/03/2016   Elevated coronary artery calcium  score 02/21/2016   Bruit of right carotid artery 02/21/2016   Pseudophakia of both eyes 06/01/2015   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   Dyslipidemia 05/09/2013   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   HTN (hypertension) 09/09/2012   Mixed hyperlipidemia 09/09/2012   Perianal abscess 07/26/2012   Corneal scarring 01/12/2012   Nuclear cataract 01/12/2012    Past Surgical History:  Procedure Laterality Date   ABDOMINAL HYSTERECTOMY  1978   Partial   BREAST BIOPSY Left 07/26/1999   CATARACT EXTRACTION     TOTAL SHOULDER ARTHROPLASTY Right 08/26/2018   Procedure: RIGHT TOTAL SHOULDER ARTHROPLASTY;  Surgeon: Dozier Soulier, MD;  Location: [facility];  Service: Orthopedics;  Laterality: Right;    OB  History     Gravida  2   Para      Term      Preterm      AB      Living  2      SAB      IAB      Ectopic      Multiple      Live Births  2            Home Medications    Prior to Admission medications   Medication Sig Start Date End Date Taking? Authorizing Provider  aspirin  EC 81 MG tablet Take 81 mg by mouth daily.   Yes [provider]  baclofen  (LIORESAL ) 10 MG tablet Take 1 tablet (10 mg total) by mouth 3 (three) times daily. 10/28/23  Yes Johnie Flaming A, NP  betamethasone  valerate ointment (VALISONE ) 0.1 % Use around outer shell of ear as needed for itching *Use no longer than a week at a time* 04/14/23  Yes [provider]  cephALEXin  (KEFLEX ) 500 MG capsule Take 1 capsule (500 mg total) by mouth 2 (two) times daily. 10/27/23  Yes Zuleta, Kaitlin G, NP  clobetasol  (TEMOVATE ) 0.05 % external solution SMARTSIG:1 Milliliter(s) Topical Every Night 05/28/23  Yes [provider]  Fluocinolone Acetonide 0.01 % OIL Use several drops in ear canal as needed for itching *use no longer than a week at a time* 04/14/23  Yes [provider]  atorvastatin  (LIPITOR) 40 MG tablet Take 40 mg by mouth daily.    [provider]  hydrocortisone  2.5 % lotion Apply topically 2 (two) times daily. Left external ear 09/03/18   Brien Therisa MATSU, MD  losartan -hydrochlorothiazide  (HYZAAR) 100-12.5 MG per tablet Take 1 tablet by mouth daily.    [provider]  meloxicam (MOBIC) 7.5 MG tablet Take 7.5 mg by mouth daily.    [provider]  methenamine  (HIPREX ) 1 g tablet Take 1 tablet (1 g total) by mouth 2 (two) times daily with a meal. 08/12/23   Zuleta, Kaitlin G, NP  metoprolol  succinate (TOPROL -XL) 25 MG 24 hr tablet Take 1 tablet (25 mg total) by mouth daily. NEED OV. 07/10/23   Lavona Agent, MD  neomycin -polymyxin-hydrocortisone  (CORTISPORIN) 3.5-10000-1 OTIC suspension Place 4 drops into the left ear 3 (three) times daily.  09/03/18   Brien Therisa MATSU, MD    Family History Family History  Problem Relation Age of Onset   Alzheimer's disease Mother        died at 5   Breast cancer Mother            Heart attack Father 27       died at 39   Crohn's disease Son     Social History Social History   Tobacco Use   Smoking status: Never   Smokeless tobacco: Never  Vaping Use   Vaping status: Never Used  Substance Use Topics   Alcohol use: Yes    Alcohol/week: 1.0 - 2.0 standard drink of alcohol    Types: 1 - 2 Standard drinks or equivalent per week    Comment: occasionally.    Drug use: No    Frequency: 2.0 times per week     Allergies   Ciprofloxacin, Macrodantin [nitrofurantoin macrocrystal], Metronidazole, Naproxen, Vitamin d (calciferol), and Bactrim  [sulfamethoxazole -trimethoprim ]   Review of Systems Review of Systems  Musculoskeletal:  Positive for arthralgias, gait problem and myalgias.     Physical Exam Triage Vital Signs ED Triage Vitals  Encounter Vitals Group     BP 10/28/23 0849 98/64     Systolic BP Percentile --      Diastolic BP Percentile --      Pulse Rate 10/28/23 0849 60     Resp 10/28/23 0849 18     Temp 10/28/23 0849 98.1 F (36.7 C)     Temp Source 10/28/23 0849 Oral     SpO2 10/28/23 0849 98 %     Weight --      Height --      Head Circumference --      Peak Flow --      Pain Score 10/28/23 0845 10     Pain Loc --      Pain Education --      Exclude from Growth Chart --    No data found.  Updated Vital Signs BP 94/62   Pulse 60   Temp 98.1 F (36.7 C) (Oral)   Resp 18   SpO2 98%   Visual Acuity Right Eye Distance:   Left Eye Distance:   Bilateral Distance:    Right Eye Near:   Left Eye Near:    Bilateral Near:     Physical Exam Vitals and nursing note reviewed.  Constitutional:      General: She is awake. She is not in acute distress.    Appearance: Normal appearance. She is well-developed and well-groomed. She is not ill-appearing.   Cardiovascular:  Rate and Rhythm: Normal rate and regular rhythm.  Pulmonary:     Effort: Pulmonary effort is normal.     Breath sounds: Normal breath sounds.  Musculoskeletal:        General: Normal range of motion.     Right hip: Normal.     Right lower leg: Swelling present.     Right foot: Normal range of motion. Tenderness present.     Comments: Area of swelling noted to lateral aspect of right calf without tenderness.  Multiple varicose veins noted. Mild tenderness noted to right heel.  Skin:    General: Skin is warm and dry.  Neurological:     Mental Status: She is alert.  Psychiatric:        Behavior: Behavior is cooperative.      UC Treatments / Results  Labs (all labs ordered are listed, but only abnormal results are displayed) Labs Reviewed - No data to display  EKG   Radiology No results found.  Procedures Procedures (including critical care time)  Medications Ordered in UC Medications - No data to display  Initial Impression / Assessment and Plan / UC Course  I have reviewed the triage vital signs and the nursing notes.  Pertinent labs & imaging results that were available during my care of the patient were reviewed by me and considered in my medical decision making (see chart for details).     Patient presented with right hip, calf, and heel pain that began 5 days ago.  Patient describes pain as burning.  Patient noticed a knot on her right calf that was about the size of an egg yesterday.  Patient states the knot is smaller today.  Patient reports applying Voltaren to hip and heel at this morning with relief of pain.  Patient is concerned she may have a blood clot.  Upon assessment there is an area of swelling noted to the lateral aspect of right calf without tenderness.  Multiple varicose veins noted.  Patient also endorses mild tenderness to right heel.  No other significant findings upon exam.  Patient is able to ambulate without difficulty in  clinic.  Ordered outpatient venous study to rule out blood clot.  Patient was persistent about receiving a prescription for the pain.  Prescribed baclofen  as needed for pain.  Discussed follow-up and return precautions. Final Clinical Impressions(s) / UC Diagnoses   Final diagnoses:  Pain of right lower extremity  Varicose veins of leg with swelling, right     Discharge Instructions      We have ordered a venous study for you today.  Please go to this appointment to have imaging done to rule out blood clot.  I have prescribed baclofen  which you can take up to 3 times daily as needed for your pain.  This can make you drowsy so do not drive while taking.  Otherwise alternate between naproxen and Tylenol  as needed for pain.  You can also alternate between heat and ice for pain.  If pain persists follow-up with primary care provider for further evaluation.    ED Prescriptions     Medication Sig Dispense Auth. Provider   baclofen  (LIORESAL ) 10 MG tablet Take 1 tablet (10 mg total) by mouth 3 (three) times daily. 30 each Johnie Rumaldo LABOR, NP      PDMP not reviewed this encounter.   Johnie Rumaldo A, [place] 10/28/23 [phone]

## 2023-10-28 NOTE — ED Triage Notes (Addendum)
 Pt c/o of right hip pain, right calf pain, and right heel pain. She has had a  knot on her calf on the side of leg that has been the size of an egg according to her daughter.   Pt was seen yesterday for UTI and has started antibiotics.   Start date: 10/24/2023  Home Interventions: Voltaren

## 2023-10-30 ENCOUNTER — Ambulatory Visit (INDEPENDENT_AMBULATORY_CARE_PROVIDER_SITE_OTHER): Payer: Medicare Other | Admitting: Obstetrics and Gynecology

## 2023-10-30 ENCOUNTER — Encounter: Payer: Self-pay | Admitting: Obstetrics and Gynecology

## 2023-10-30 VITALS — BP 120/78 | HR 71

## 2023-10-30 DIAGNOSIS — R350 Frequency of micturition: Secondary | ICD-10-CM | POA: Diagnosis not present

## 2023-10-30 DIAGNOSIS — Z87440 Personal history of urinary (tract) infections: Secondary | ICD-10-CM | POA: Diagnosis not present

## 2023-10-30 DIAGNOSIS — N390 Urinary tract infection, site not specified: Secondary | ICD-10-CM

## 2023-10-30 LAB — POCT URINALYSIS DIPSTICK
Bilirubin, UA: NEGATIVE
Blood, UA: NEGATIVE
Glucose, UA: NEGATIVE
Ketones, UA: NEGATIVE
Nitrite, UA: NEGATIVE
Protein, UA: NEGATIVE
Spec Grav, UA: 1.02 (ref 1.010–1.025)
Urobilinogen, UA: 0.2 U/dL
pH, UA: 5.5 (ref 5.0–8.0)

## 2023-10-30 MED ORDER — METHENAMINE HIPPURATE 1 G PO TABS: 1.0000 g | ORAL_TABLET | Freq: Every day | ORAL | 3 refills | Status: DC

## 2023-10-30 MED ORDER — ESTRADIOL 0.1 MG/GM VA CREA: TOPICAL_CREAM | VAGINAL | 11 refills | Status: DC

## 2023-10-30 NOTE — Progress Notes (Signed)
 [facility] Return Visit  SUBJECTIVE  History of Present Illness: Melissa Lane is a 89 y.o. female seen in follow-up for rUTI.  Urinary urgency/ frequency and ABL is well controlled with dietary changes.   Had recent UTI on 10/27/23- culture is still in progress but positive for klebsiella aerogenes, and pseudomonas aeruginosa. Prescribed keflex . She is not sure it is working for her.   She has been on hiprex  but only taking once a day. Previously reported that vaginal estrogen cream was too messy. Yuvafem  prescribed but difficult for her to place with her arthritis. Estring  was not covered.   Past Medical History: Patient  has a past medical history of Arthritis, Coronary artery calcification seen on CAT scan (2016), Hyperlipidemia, Hypertension, Peri-rectal abscess (2013), and SVT (supraventricular tachycardia) (HCC) (05/2018).   Past Surgical History: She  has a past surgical history that includes Abdominal hysterectomy (1978); Cataract extraction; Breast biopsy (Left, 07/26/1999); and Total shoulder arthroplasty (Right, 08/26/2018).   Medications: She has a current medication list which includes the following prescription(s): [START ON 11/02/2023] estradiol , aspirin  ec, atorvastatin , baclofen , betamethasone  valerate ointment, cephalexin , clobetasol , fluocinolone acetonide, hydrocortisone , losartan -hydrochlorothiazide , meloxicam, methenamine , metoprolol  succinate, and neomycin -polymyxin-hydrocortisone .   Allergies: Patient is allergic to ciprofloxacin, macrodantin [nitrofurantoin macrocrystal], metronidazole, naproxen, vitamin d (calciferol), and bactrim  [sulfamethoxazole -trimethoprim ].   Social History: Patient  reports that she has never smoked. She has never used smokeless tobacco. She reports current alcohol use of about 1.0 - 2.0 standard drink of alcohol per week. She reports that she does not use drugs.      OBJECTIVE     Physical Exam: Vitals:   10/30/23 1351   BP: 120/78  Pulse: 71   Gen: No apparent distress, A&O x 3.  Detailed Urogynecologic Evaluation:  Deferred.    ASSESSMENT AND PLAN    [name] is a 89 y.o. with:  1. Urinary frequency   2. Recurrent UTI     - Continue Hiprex  1g daily.  - Pt feels that she would be able to place estrace  cream with one finger. New Rx provided to use 0.5g twice a week. Emphasized that this can help prevent urinary tract infections.  - Will notify her when urine culture is finalized if we need to change antibiotics. For now, continue with keflex .  - She will call our office with any urinary symptoms   Patient to follow up in 6 months or sooner if needed.   Urinary frequency -     POCT urinalysis dipstick -     Methenamine  Hippurate; Take 1 tablet (1 g total) by mouth daily.  Dispense: 90 tablet; Refill: 3  Recurrent UTI -     Estradiol ; Place 0.5g twice a week at night (Tuesday and Thursday)  Dispense: 30 g; Refill: 11 -     Methenamine  Hippurate; Take 1 tablet (1 g total) by mouth daily.  Dispense: 90 tablet; Refill: 3    Rosaline LOISE Caper, MD

## 2023-10-30 NOTE — Patient Instructions (Signed)
 For prevention of urinary tract infections:   Start estrogen cream in the vagina two times a week at night. Place a pea sized amount of cream on your finger and place in the vagina on Tuesdays and Thursday at night.  Continue with Hiprex  once a day If you have symptoms of a UTI, call our office to give a urine sample for culture.   You have a urine culture in progress. We will call you when the culture is completed to see if you need to change antibiotics.

## 2023-11-01 LAB — URINE CULTURE: Culture: 50000 — AB

## 2023-11-01 LAB — CARBAPENEM RESISTANCE PANEL
Carba Resistance IMP Gene: NOT DETECTED
Carba Resistance KPC Gene: NOT DETECTED
Carba Resistance NDM Gene: NOT DETECTED
Carba Resistance OXA48 Gene: NOT DETECTED
Carba Resistance VIM Gene: NOT DETECTED

## 2023-11-03 ENCOUNTER — Encounter: Payer: Self-pay | Admitting: Obstetrics and Gynecology

## 2023-11-03 MED ORDER — FOSFOMYCIN TROMETHAMINE 3 G PO PACK: 3.0000 g | PACK | Freq: Once | ORAL | 0 refills | Status: AC

## 2023-11-03 NOTE — Addendum Note (Signed)
Addended by: Selmer Dominion on: 11/03/2023 10:03 AM   Modules accepted: Orders

## 2023-11-05 NOTE — Progress Notes (Signed)
Submitted PA for Fosfomycin on Cover my Meds. KeyEnriqueta Lane -  PA Case ID: [id number]     Received instant approval: Case [id number] Status: Approved or Denied Prior Auth: Coverage Start Date:09/23/2023 - Coverage End Date:09/21/2024

## 2023-11-05 NOTE — Telephone Encounter (Signed)
Patient medication has been approved.

## 2023-11-17 ENCOUNTER — Encounter: Payer: Self-pay | Admitting: Obstetrics and Gynecology

## 2023-11-17 ENCOUNTER — Ambulatory Visit (INDEPENDENT_AMBULATORY_CARE_PROVIDER_SITE_OTHER): Payer: Medicare Other | Admitting: Obstetrics and Gynecology

## 2023-11-17 ENCOUNTER — Other Ambulatory Visit (HOSPITAL_COMMUNITY)
Admission: RE | Admit: 2023-11-17 | Discharge: 2023-11-17 | Disposition: A | Discharge status: Home / Self Care | Payer: Medicare Other | Source: Ambulatory Visit | Attending: Obstetrics and Gynecology | Admitting: Obstetrics and Gynecology

## 2023-11-17 VITALS — BP 127/76 | HR 71

## 2023-11-17 DIAGNOSIS — N390 Urinary tract infection, site not specified: Secondary | ICD-10-CM | POA: Diagnosis not present

## 2023-11-17 DIAGNOSIS — Z87440 Personal history of urinary (tract) infections: Secondary | ICD-10-CM | POA: Diagnosis not present

## 2023-11-17 DIAGNOSIS — N898 Other specified noninflammatory disorders of vagina: Secondary | ICD-10-CM

## 2023-11-17 DIAGNOSIS — L900 Lichen sclerosus et atrophicus: Secondary | ICD-10-CM | POA: Diagnosis not present

## 2023-11-17 LAB — POCT URINALYSIS DIPSTICK
Bilirubin, UA: NEGATIVE
Blood, UA: NEGATIVE
Glucose, UA: NEGATIVE
Ketones, UA: NEGATIVE
Leukocytes, UA: NEGATIVE
Nitrite, UA: NEGATIVE
Protein, UA: POSITIVE — AB
Spec Grav, UA: 1.025 (ref 1.010–1.025)
Urobilinogen, UA: 0.2 U/dL
pH, UA: 5.5 (ref 5.0–8.0)

## 2023-11-17 MED ORDER — CLOBETASOL PROPIONATE E 0.05 % EX CREA: 1.0000 | TOPICAL_CREAM | Freq: Every evening | CUTANEOUS | 2 refills | Status: DC

## 2023-11-17 NOTE — Progress Notes (Signed)
 [facility] Return Visit  SUBJECTIVE  History of Present Illness: Melissa Lane is a 89 y.o. female seen in follow-up for rUTI. Plan at last visit was continue Hiprex.     Past Medical History: Patient  has a past medical history of Arthritis, Coronary artery calcification seen on CAT scan (2016), Hyperlipidemia, Hypertension, Peri-rectal abscess (2013), and SVT (supraventricular tachycardia) (HCC) (05/2018).   Past Surgical History: She  has a past surgical history that includes Abdominal hysterectomy (1978); Cataract extraction; Breast biopsy (Left, 07/26/1999); and Total shoulder arthroplasty (Right, 08/26/2018).   Medications: She has a current medication list which includes the following prescription(s): clobetasol propionate e, aspirin ec, atorvastatin, baclofen, cephalexin, estradiol, fluocinolone acetonide, hydrocortisone, losartan-hydrochlorothiazide, meloxicam, methenamine, metoprolol succinate, and neomycin-polymyxin-hydrocortisone.   Allergies: Patient is allergic to ciprofloxacin, macrodantin [nitrofurantoin macrocrystal], metronidazole, naproxen, vitamin d (calciferol), and bactrim [sulfamethoxazole-trimethoprim].   Social History: Patient  reports that she has never smoked. She has never used smokeless tobacco. She reports current alcohol use of about 1.0 - 2.0 standard drink of alcohol per week. She reports that she does not use drugs.     OBJECTIVE    Lab Results  Component Value Date   COLORU yellow 11/17/2023   CLARITYU clear 11/17/2023   GLUCOSEUR Negative 11/17/2023   BILIRUBINUR Negative 11/17/2023   KETONESU Negative 11/17/2023   SPECGRAV 1.025 11/17/2023   RBCUR Negative 11/17/2023   PHUR 5.5 11/17/2023   PROTEINUR Positive (A) 11/17/2023   UROBILINOGEN 0.2 11/17/2023   LEUKOCYTESUR Negative 11/17/2023    Physical Exam: Vitals:   11/17/23 0923  BP: 127/76  Pulse: 71   Gen: No apparent distress, A&O x 3.  Detailed Urogynecologic  Evaluation:  Vaginal opening is red, inflamed and has obvious discoloration with concerns for agglutination. Aptima swab obtained with concern for Lichen's. Urethra normal in appearance with no sign of bleeding or discharge.    ASSESSMENT AND PLAN    [name] is a 89 y.o. with:  1. Lichen sclerosus   2. Recurrent UTI   3. Vaginal discharge    Patient has significant discoloration and vaginal irritation that is concerning for Lichen's sclerosus. Will start her on low dose steroid cream vaginally.  Patient reports she is symptomatic of UTI. Will send for culture, but no obvious signs of UTI today. Cath sample obtained.  Aptima swab obtained to rule out yeast or BV infection.    Selmer Dominion, NP

## 2023-11-17 NOTE — Patient Instructions (Signed)
 Use Clobetasol cream nightly for the next month on the labia.   We will send off your urine for culture and a swab to rule out yeast and BV.   I will call you with results and a plan of care.

## 2023-11-18 ENCOUNTER — Encounter: Payer: Self-pay | Admitting: Obstetrics and Gynecology

## 2023-11-18 LAB — CERVICOVAGINAL ANCILLARY ONLY
Bacterial Vaginitis (gardnerella): NEGATIVE
Candida Glabrata: NEGATIVE
Candida Vaginitis: NEGATIVE
Comment: NEGATIVE
Comment: NEGATIVE
Comment: NEGATIVE

## 2023-11-18 LAB — URINE CULTURE: Culture: <10000 — AB

## 2023-12-16 IMAGING — MG MM DIGITAL SCREENING BILAT W/ TOMO AND CAD
8 series · 8 of 24 positions shown · non-contrast
Comparison: Previous exam(s).

CLINICAL DATA: Screening.

EXAM:
DIGITAL SCREENING BILATERAL MAMMOGRAM WITH TOMOSYNTHESIS AND CAD
TECHNIQUE: Bilateral screening digital craniocaudal and mediolateral oblique
mammograms were obtained. Bilateral screening digital breast
tomosynthesis was performed. The images were evaluated with
computer-aided detection. Best images possible per technologist
communication.

[R MLO synth-2D]
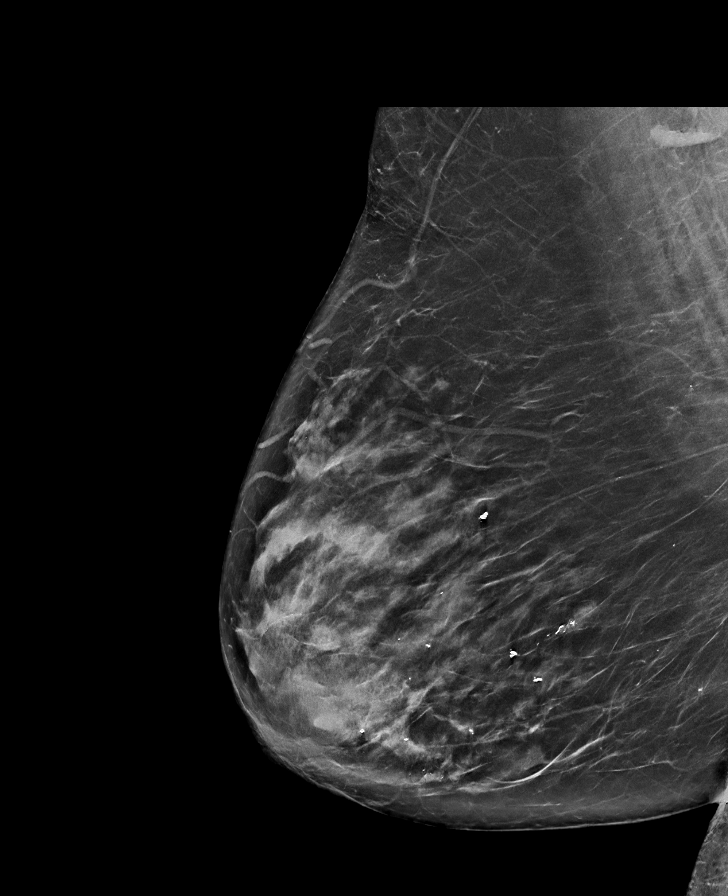

[R CC synth-2D]
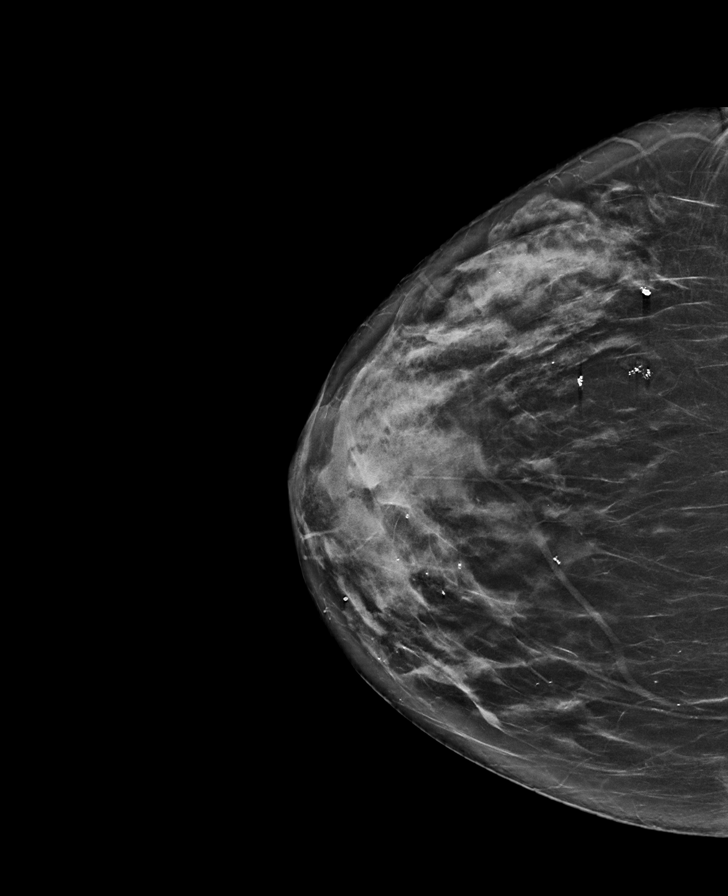

[L MLO synth-2D]
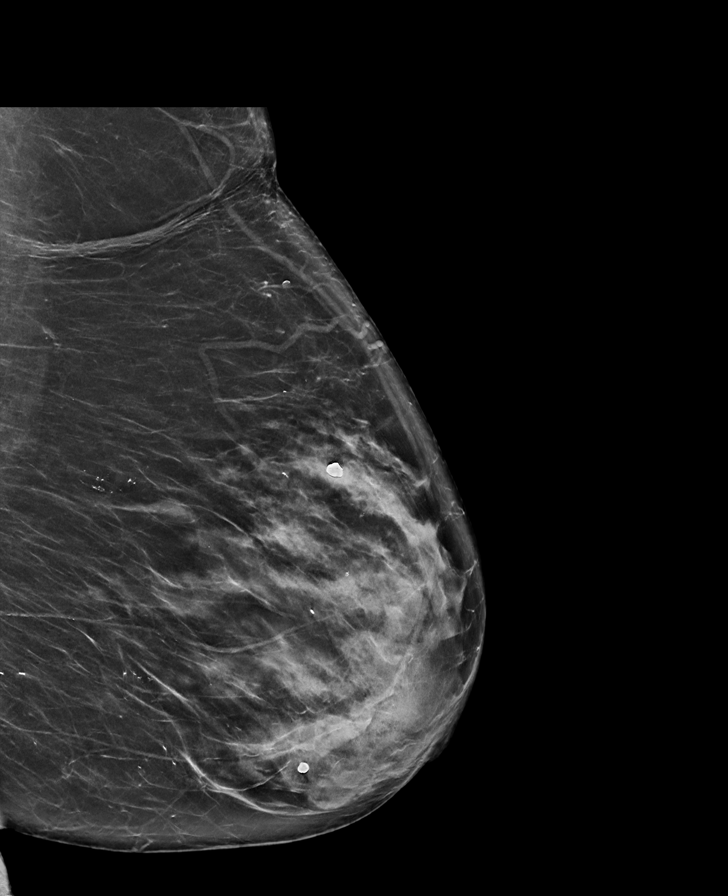

[L CC synth-2D]
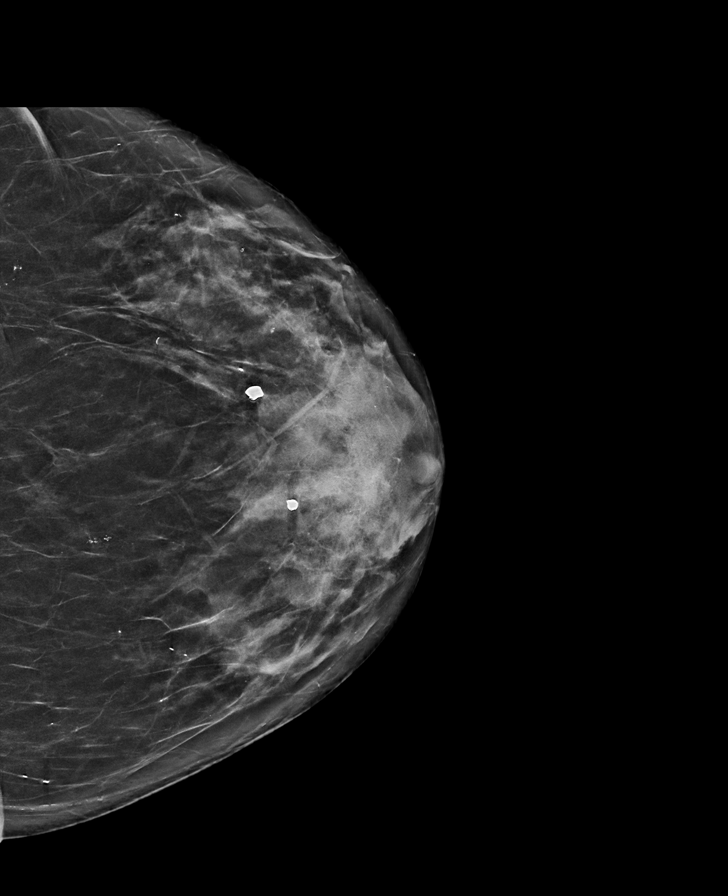

[R CC tomo · tomo slice 35/68.0]
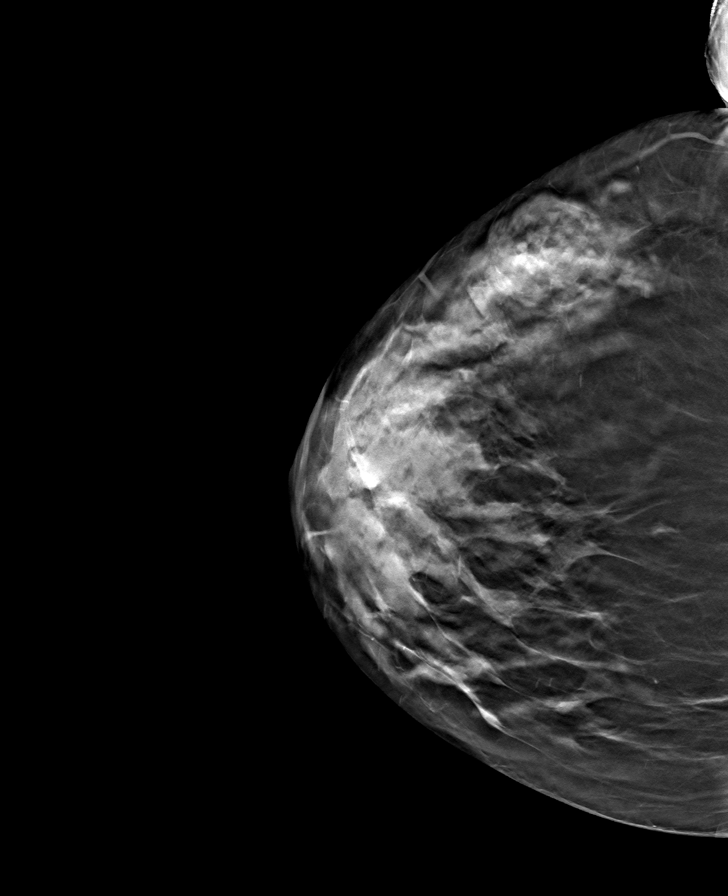

[L CC tomo · tomo slice 34/67.0]
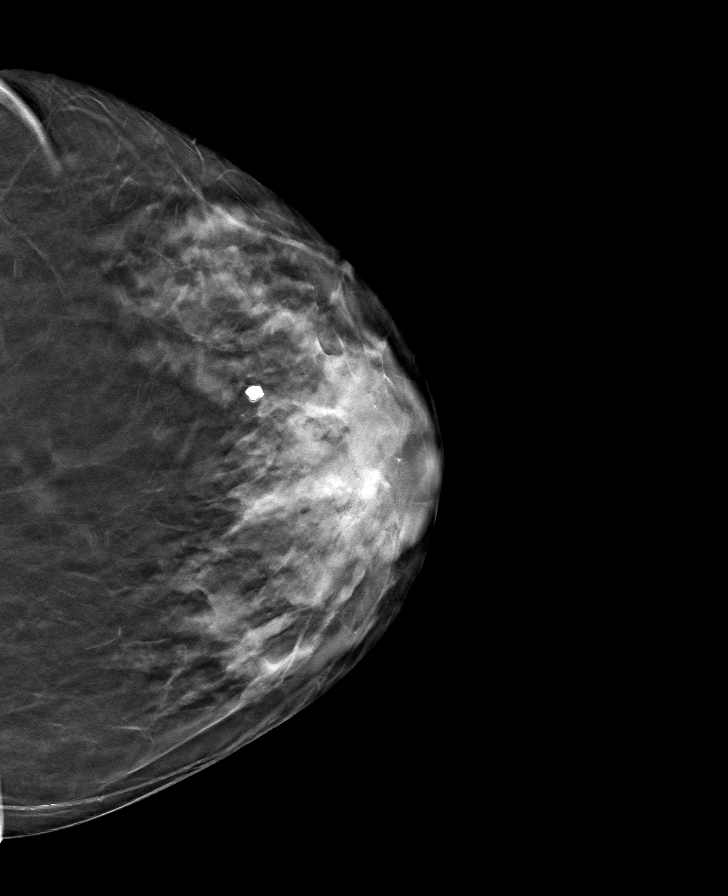

[L MLO tomo · tomo slice 39/76.0]
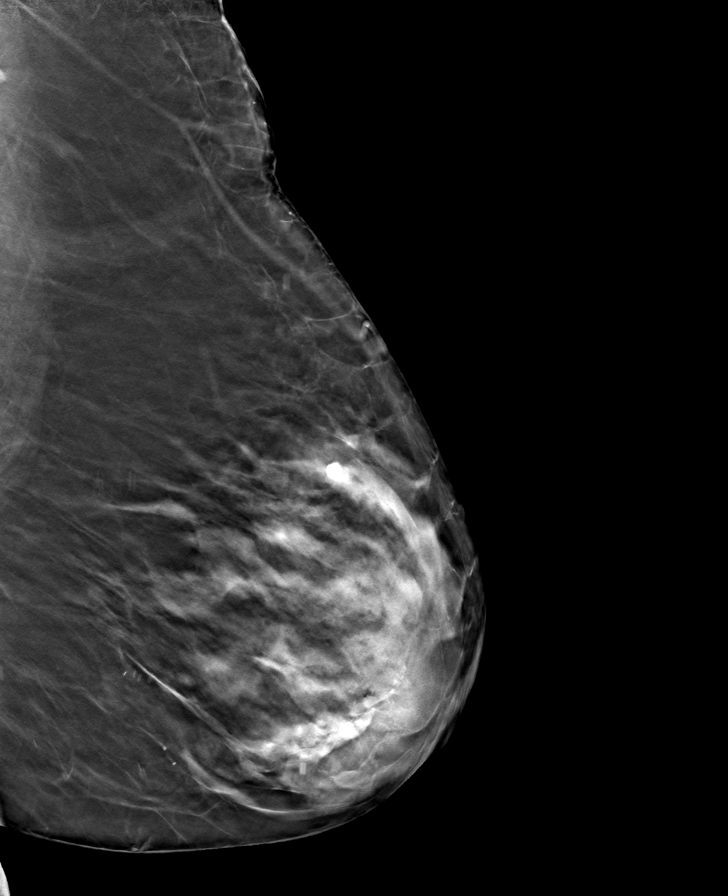

[R MLO tomo · tomo slice 39/76.0]
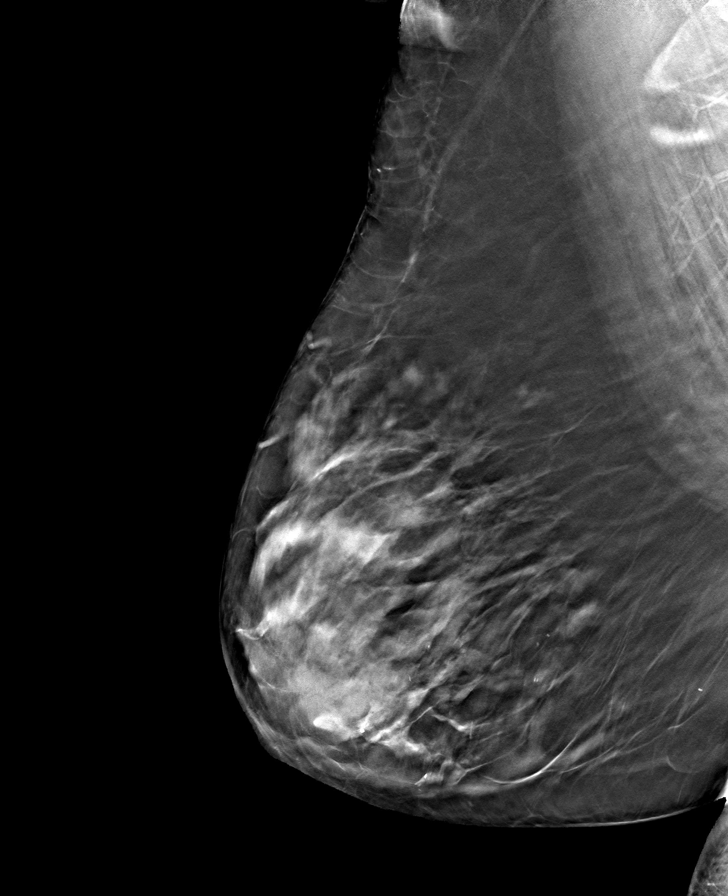

[8 of 24 positions shown; findings below may reference images not displayed]

ACR Breast Density Category c: The breast tissue is heterogeneously
dense, which may obscure small masses.
FINDINGS: There are no findings suspicious for malignancy.
IMPRESSION: No mammographic evidence of malignancy. A result letter of this
screening mammogram will be mailed directly to the patient.

RECOMMENDATION:
Screening mammogram in one year. (Code:0J-M-42K)

BI-RADS CATEGORY  1: Negative.

## 2024-01-04 IMAGING — CT CT ABD-PELV W/O CM
2 of 4 series · 16 of 46 positions shown, 18 images · non-contrast
Comparison: CT abdomen pelvis dated March 01, 2018.

CLINICAL DATA: Left lower quadrant abdominal pain constipation for
the past 5 days.



[Series 3: ap without · axial · non-contrast · 0.86mm/px · z∈[-724,-284]mm · 13 of 100 slices shown, 15 images]
[im 6/100  soft-tissue]
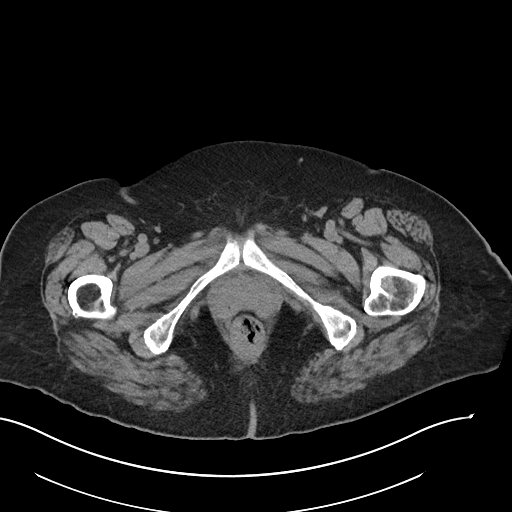
[im 6/100  bone]
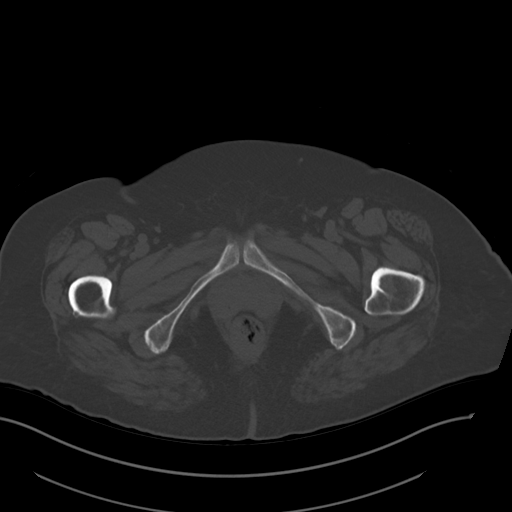
[im 12/100  soft-tissue]
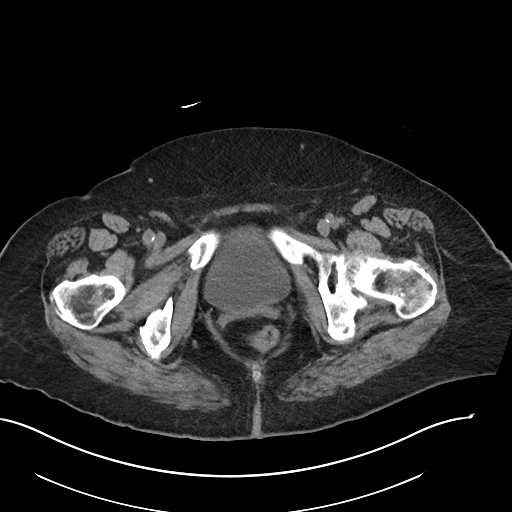
[im 24/100  soft-tissue]
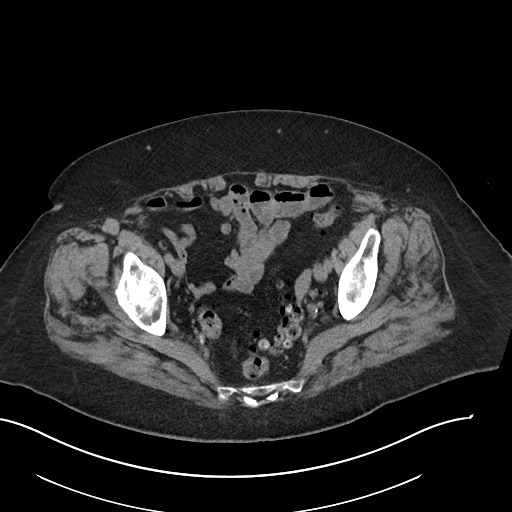
[im 30/100  soft-tissue]
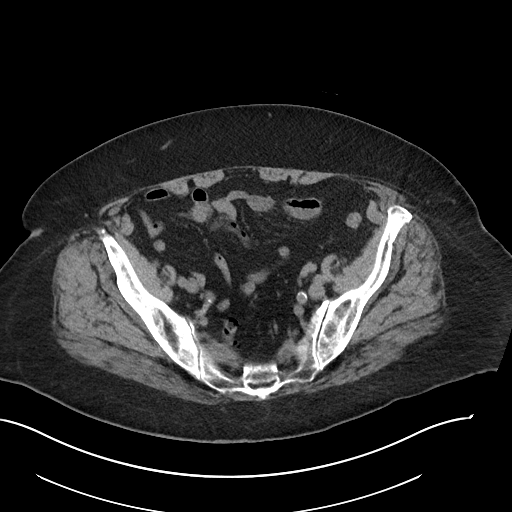
[im 35/100  soft-tissue]
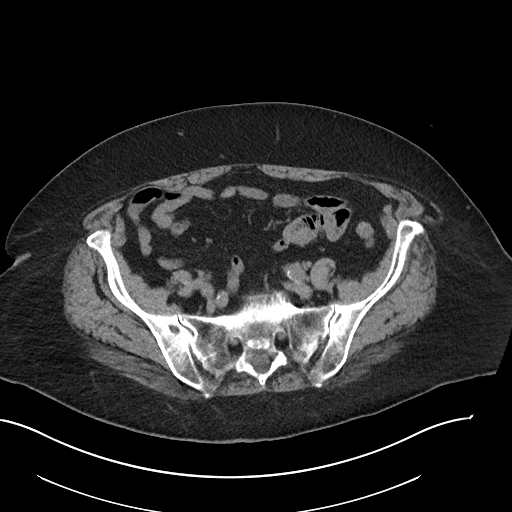
[im 41/100  soft-tissue]
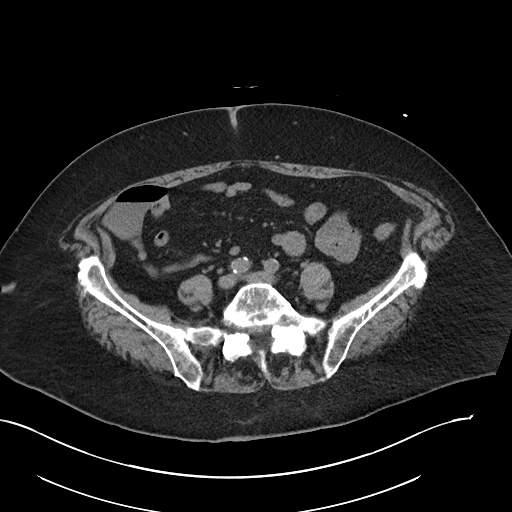
[im 53/100  soft-tissue]
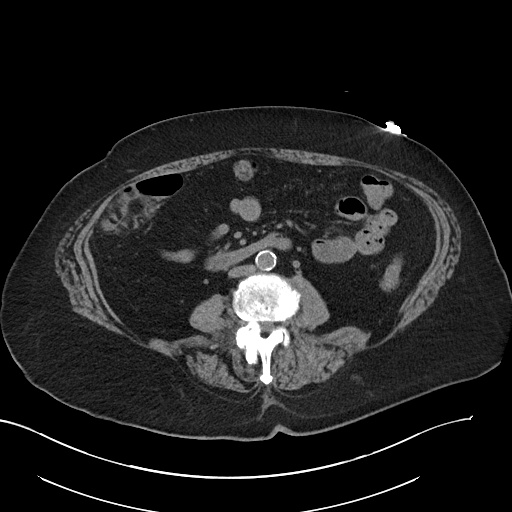
[im 59/100  soft-tissue]
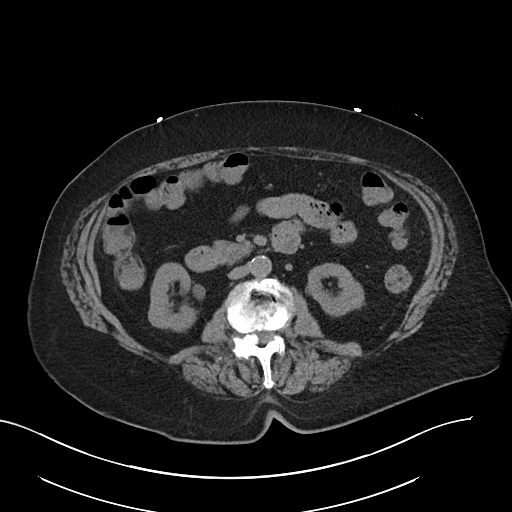
[im 65/100  soft-tissue]
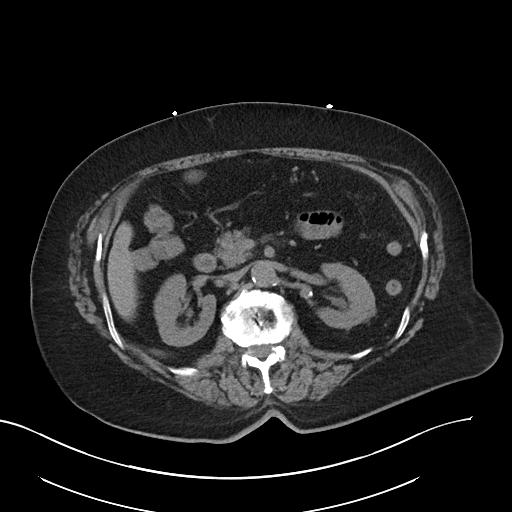
[im 65/100  bone]
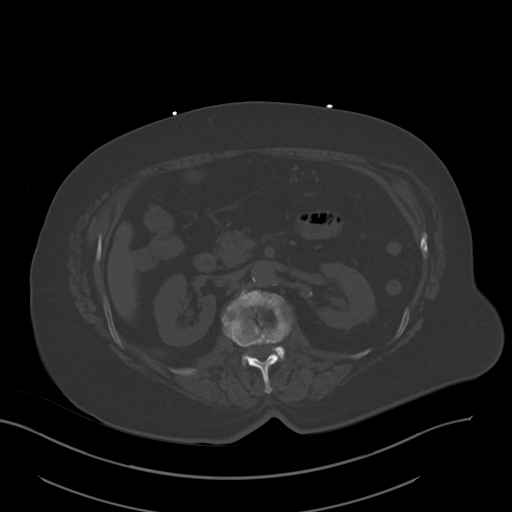
[im 70/100  soft-tissue]
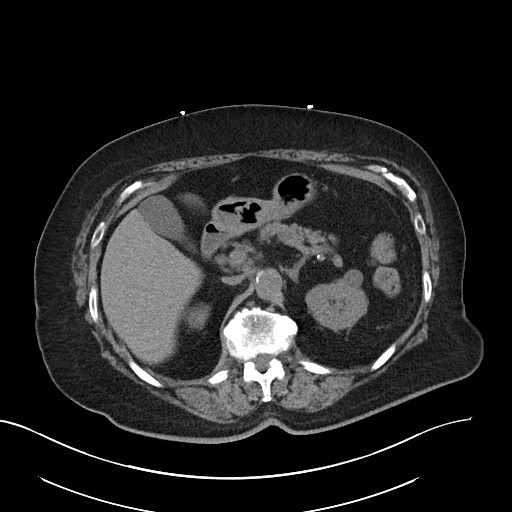
[im 76/100  soft-tissue]
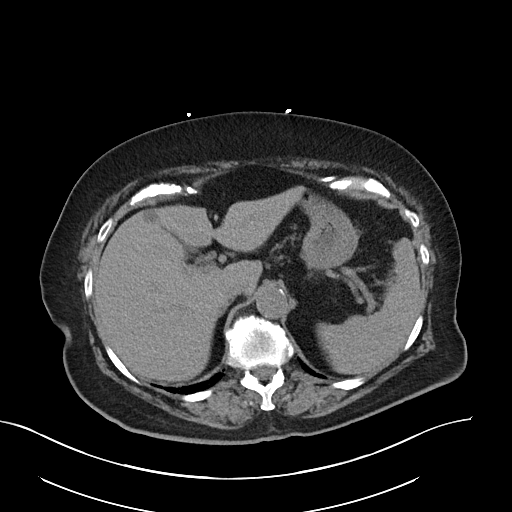
[im 88/100  soft-tissue]
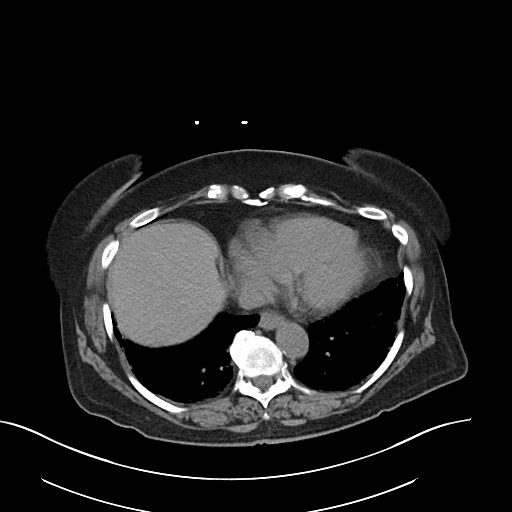
[im 94/100  soft-tissue]
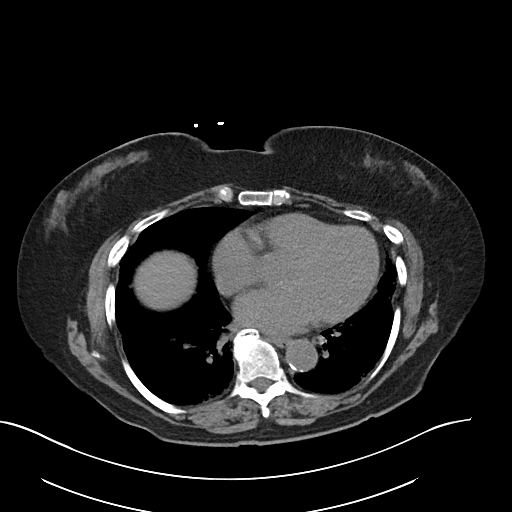

[Series 6: cor · coronal · 0.93mm/px · 3 of 95 slices shown]
[im 32/95  soft-tissue]
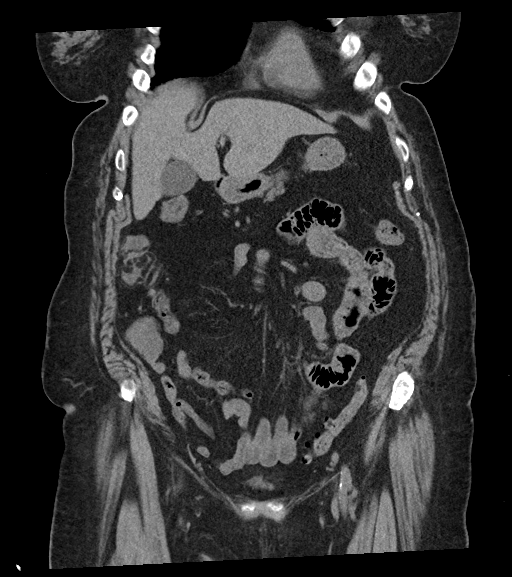
[im 42/95  soft-tissue]
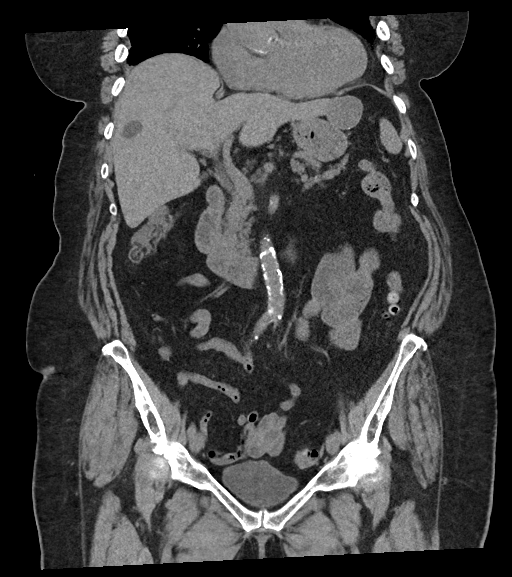
[im 53/95  soft-tissue]
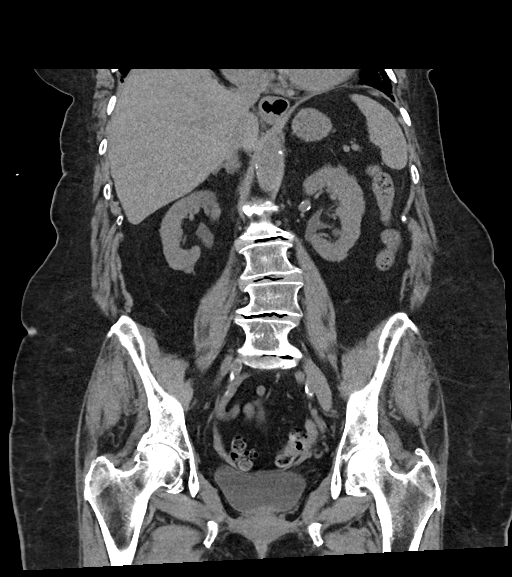

[16 of 46 positions shown; findings below may reference images not displayed]

FINDINGS: Lower chest: Peripheral subpleural reticulation, architectural
distortion, and bronchiectasis at both lung bases, progressed since
7466. No acute abnormality.

Hepatobiliary: Multiple liver cysts again noted. The exophytic cyst
in the peripheral left liver has mildly involuted in the interim.
Unchanged tiny gallstones. No gallbladder wall thickening or biliary
dilatation.

Pancreas: Unremarkable. No pancreatic ductal dilatation or
surrounding inflammatory changes.

Spleen: Normal in size without focal abnormality.

Adrenals/Urinary Tract: Adrenal glands are unremarkable. Unchanged
left renal simple cysts. No follow-up imaging is recommended. No
renal calculi or hydronephrosis. The bladder is unremarkable.

Stomach/Bowel: Small hiatal hernia. The stomach is otherwise within
normal limits. No bowel wall thickening, distention, or surrounding
inflammatory changes. Severe sigmoid colonic diverticulosis. Chronic
prominent submucosal fat in the cecum and ascending colon, likely
sequelae of prior colitis. No significant colonic stool burden.
Normal appendix.

Vascular/Lymphatic: Aortic atherosclerosis. No enlarged abdominal or
pelvic lymph nodes.

Reproductive: Status post hysterectomy. No adnexal masses.

Other: No abdominal wall hernia or abnormality. No abdominopelvic
ascites. No pneumoperitoneum.

Musculoskeletal: No acute or significant osseous findings.
IMPRESSION: 1. No acute intra-abdominal process.
2. Severe left-sided colonic diverticulosis without evidence of
acute diverticulitis.
3. Unchanged cholelithiasis.
4. Aortic Atherosclerosis (994O4-ZNP.P).

## 2024-01-13 DIAGNOSIS — Z6834 Body mass index (BMI) 34.0-34.9, adult: Secondary | ICD-10-CM | POA: Diagnosis not present

## 2024-01-13 DIAGNOSIS — R1030 Lower abdominal pain, unspecified: Secondary | ICD-10-CM | POA: Diagnosis not present

## 2024-01-13 DIAGNOSIS — K5900 Constipation, unspecified: Secondary | ICD-10-CM | POA: Diagnosis not present

## 2024-01-13 DIAGNOSIS — I10 Essential (primary) hypertension: Secondary | ICD-10-CM | POA: Diagnosis not present

## 2024-01-13 DIAGNOSIS — R1013 Epigastric pain: Secondary | ICD-10-CM | POA: Diagnosis not present

## 2024-01-13 DIAGNOSIS — M545 Low back pain, unspecified: Secondary | ICD-10-CM | POA: Diagnosis not present

## 2024-01-18 DIAGNOSIS — K5790 Diverticulosis of intestine, part unspecified, without perforation or abscess without bleeding: Secondary | ICD-10-CM | POA: Diagnosis not present

## 2024-01-18 DIAGNOSIS — N390 Urinary tract infection, site not specified: Secondary | ICD-10-CM | POA: Diagnosis not present

## 2024-01-18 DIAGNOSIS — R1032 Left lower quadrant pain: Secondary | ICD-10-CM | POA: Diagnosis not present

## 2024-01-21 DIAGNOSIS — R109 Unspecified abdominal pain: Secondary | ICD-10-CM | POA: Diagnosis not present

## 2024-01-21 DIAGNOSIS — K5900 Constipation, unspecified: Secondary | ICD-10-CM | POA: Diagnosis not present

## 2024-01-25 DIAGNOSIS — K5790 Diverticulosis of intestine, part unspecified, without perforation or abscess without bleeding: Secondary | ICD-10-CM | POA: Diagnosis not present

## 2024-01-28 DIAGNOSIS — R109 Unspecified abdominal pain: Secondary | ICD-10-CM | POA: Diagnosis not present

## 2024-01-28 DIAGNOSIS — K5900 Constipation, unspecified: Secondary | ICD-10-CM | POA: Diagnosis not present

## 2024-02-01 DIAGNOSIS — R9389 Abnormal findings on diagnostic imaging of other specified body structures: Secondary | ICD-10-CM | POA: Diagnosis not present

## 2024-02-08 DIAGNOSIS — R109 Unspecified abdominal pain: Secondary | ICD-10-CM | POA: Diagnosis not present

## 2024-02-08 DIAGNOSIS — K5900 Constipation, unspecified: Secondary | ICD-10-CM | POA: Diagnosis not present

## 2024-02-08 DIAGNOSIS — I10 Essential (primary) hypertension: Secondary | ICD-10-CM | POA: Diagnosis not present

## 2024-02-08 DIAGNOSIS — Z6834 Body mass index (BMI) 34.0-34.9, adult: Secondary | ICD-10-CM | POA: Diagnosis not present

## 2024-02-10 ENCOUNTER — Ambulatory Visit: Payer: Medicare Other | Admitting: Obstetrics and Gynecology

## 2024-02-18 ENCOUNTER — Ambulatory Visit (INDEPENDENT_AMBULATORY_CARE_PROVIDER_SITE_OTHER)

## 2024-02-18 ENCOUNTER — Other Ambulatory Visit (HOSPITAL_COMMUNITY)
Admission: RE | Admit: 2024-02-18 | Discharge: 2024-02-18 | Disposition: A | Discharge status: Home / Self Care | Source: Other Acute Inpatient Hospital | Attending: Obstetrics and Gynecology | Admitting: Obstetrics and Gynecology

## 2024-02-18 VITALS — BP 146/61 | HR 62 | Temp 98.1°F

## 2024-02-18 DIAGNOSIS — N390 Urinary tract infection, site not specified: Secondary | ICD-10-CM | POA: Diagnosis not present

## 2024-02-18 DIAGNOSIS — R350 Frequency of micturition: Secondary | ICD-10-CM

## 2024-02-18 LAB — POCT URINALYSIS DIPSTICK
Blood, UA: NEGATIVE
Glucose, UA: POSITIVE — AB
Nitrite, UA: NEGATIVE
Protein, UA: POSITIVE — AB
Spec Grav, UA: 1.025 (ref 1.010–1.025)
Urobilinogen, UA: 0.2 U/dL
pH, UA: 5.5 (ref 5.0–8.0)

## 2024-02-18 MED ORDER — FOSFOMYCIN TROMETHAMINE 3 G PO PACK: 3.0000 g | PACK | Freq: Once | ORAL | 0 refills | Status: AC

## 2024-02-18 NOTE — Patient Instructions (Signed)
 It was a pleasure to see you today!  Thank you for trusting me with your care!    Daphne Karrer,CMA

## 2024-02-18 NOTE — Progress Notes (Signed)
 Melissa Lane arrived today with cloudy urine, lower abdominal pain, nausea, and urinary frequency. Patient isexperiencing fever, unstable vitals and/or one-sided back flank pain. Patient has not had had a recent hospitalization due to UTI.  Last visit in the office was 11/17/2023.  Per protocol:   The most recent Urinalysis completed on 01-18-2024 and was notnormal.  Last Creatinine level  Lab Results  Component Value Date   CREATININE 0.99 02/10/2022    An urine specimen was collected and POCT urinalysis completed. [] A cath specimen was collected due to patient's current condition, symptoms or post-procedural state.  Total urine output by catheter is  Output by Drain (mL) 02/16/24 0701 - 02/16/24 1900 02/16/24 1901 - 02/17/24 0700 02/17/24 0701 - 02/17/24 1900 02/17/24 1901 - 02/18/24 0700 02/18/24 0701 - 02/18/24 1457  Patient has no LDAs of requested type attached.    Aaron Aas    POCT Urine results is not normal.  Urine micro was not sent per protocol for abnormal urinalysis.  Urine culture was sent per protocol for abnormal urinalysis.     [x] Pt was notified of positive urine results and plan for additional urine testing. We will contact you within the next 3-4 days with these results.   Pet last treatment from 10-2023 and the patients request. 1 dose of Fosomycin 3 grams, Rx was sent to [facility].   [] No Prescription was sent to your pharmacy.  The additional testing will indicate if a prescription is needed.   [] Patient was notified of abnormal urine results. The following prescription is sent to your preferred pharmacy.  []  Macrobid 100mg  #10 1 tablet by mouth twice daily with food for 5 days      []  Bactrim  DS 800-160mg  #6 1 tablet by mouth twice daily for 3 days        []  Due to your current medication allergies, an alternate prescription was discussed with your provider and will be prescribed and sent to your pharmacy.  [] You can take over the counter AZO two tablets up to three  times a day for two days.  Take AZO tablets with a full glass of water. AZO will turn your urine orange, this is normal.   [] The patient was notified of negative urine results.  If symptoms persist, you may take over the counter AZO two tablets up to three times a day for two days.  AZO will turn your urine orange, this is normal.  Contact the office back to schedule an appointment if your symptoms persist or worsen or you develop additional symptoms.       CC'd note to patient's provider.

## 2024-02-20 LAB — URINE CULTURE: Culture: >=100000 — AB

## 2024-02-22 ENCOUNTER — Ambulatory Visit: Payer: Self-pay | Admitting: Obstetrics and Gynecology

## 2024-02-22 ENCOUNTER — Other Ambulatory Visit: Payer: Self-pay | Admitting: Obstetrics and Gynecology

## 2024-02-22 DIAGNOSIS — N390 Urinary tract infection, site not specified: Secondary | ICD-10-CM

## 2024-02-22 MED ORDER — ESTRADIOL 7.5 MCG/24HR VA RING: 1.0000 | VAGINAL_RING | VAGINAL | 2 refills | Status: DC

## 2024-02-22 NOTE — Progress Notes (Signed)
 Called and spoke to patient on the phone. She reports she is not having UTI symptoms at this time. She states she has some reaction for Ciprofloxacin, she reports she did take Fosfomycin and is not having any current symptoms. She reports due to her physical restrictions she cannot reach to put in the estrogen cream internally. She is not able to use estrogen cream vaginally. Patient has had some significant resistant UTIs. I think it would be pertinent for her to have some sort of estrogen on board. Will see if we can get coverage for an E-string that I can place for her for vaginal atrophy and UTI prevention.

## 2024-03-10 ENCOUNTER — Emergency Department (HOSPITAL_COMMUNITY)

## 2024-03-10 ENCOUNTER — Encounter (HOSPITAL_COMMUNITY): Payer: Self-pay

## 2024-03-10 ENCOUNTER — Emergency Department (HOSPITAL_COMMUNITY)
Admission: EM | Admit: 2024-03-10 | Discharge: 2024-03-10 | Disposition: A | Discharge status: Home / Self Care | Attending: Emergency Medicine | Admitting: Emergency Medicine

## 2024-03-10 DIAGNOSIS — W010XXA Fall on same level from slipping, tripping and stumbling without subsequent striking against object, initial encounter: Secondary | ICD-10-CM | POA: Diagnosis not present

## 2024-03-10 DIAGNOSIS — Z6834 Body mass index (BMI) 34.0-34.9, adult: Secondary | ICD-10-CM | POA: Diagnosis not present

## 2024-03-10 DIAGNOSIS — R6889 Other general symptoms and signs: Secondary | ICD-10-CM | POA: Diagnosis not present

## 2024-03-10 DIAGNOSIS — Y9301 Activity, walking, marching and hiking: Secondary | ICD-10-CM | POA: Diagnosis not present

## 2024-03-10 DIAGNOSIS — I10 Essential (primary) hypertension: Secondary | ICD-10-CM | POA: Diagnosis not present

## 2024-03-10 DIAGNOSIS — S42212A Unspecified displaced fracture of surgical neck of left humerus, initial encounter for closed fracture: Secondary | ICD-10-CM | POA: Insufficient documentation

## 2024-03-10 DIAGNOSIS — Z79899 Other long term (current) drug therapy: Secondary | ICD-10-CM | POA: Insufficient documentation

## 2024-03-10 DIAGNOSIS — W19XXXA Unspecified fall, initial encounter: Secondary | ICD-10-CM | POA: Diagnosis not present

## 2024-03-10 DIAGNOSIS — S42292A Other displaced fracture of upper end of left humerus, initial encounter for closed fracture: Secondary | ICD-10-CM | POA: Diagnosis not present

## 2024-03-10 DIAGNOSIS — K5900 Constipation, unspecified: Secondary | ICD-10-CM | POA: Diagnosis not present

## 2024-03-10 DIAGNOSIS — R109 Unspecified abdominal pain: Secondary | ICD-10-CM | POA: Diagnosis not present

## 2024-03-10 DIAGNOSIS — Z7982 Long term (current) use of aspirin: Secondary | ICD-10-CM | POA: Diagnosis not present

## 2024-03-10 DIAGNOSIS — S4992XA Unspecified injury of left shoulder and upper arm, initial encounter: Secondary | ICD-10-CM | POA: Diagnosis present

## 2024-03-10 DIAGNOSIS — S42202A Unspecified fracture of upper end of left humerus, initial encounter for closed fracture: Secondary | ICD-10-CM | POA: Diagnosis not present

## 2024-03-10 MED ORDER — OXYCODONE-ACETAMINOPHEN 5-325 MG PO TABS
1.0000 | ORAL_TABLET | Freq: Once | ORAL | Status: AC
  Administered 2024-03-10: 1 via ORAL
  Filled 2024-03-10: qty 1

## 2024-03-10 MED ORDER — OXYCODONE-ACETAMINOPHEN 5-325 MG PO TABS: 1.0000 | ORAL_TABLET | Freq: Four times a day (QID) | ORAL | 0 refills | Status: DC | PRN

## 2024-03-10 NOTE — ED Triage Notes (Signed)
 Pt comes from home after a fall today around 3, states pain in the shoulder area, denies hitting her head, not taking blood thinners, not diabetic.

## 2024-03-10 NOTE — ED Notes (Signed)
 Changed into gown and ambulatory to BR. Steady gait, rn assisting. L arm wrapped in sling for comfort/support to BR

## 2024-03-10 NOTE — ED Notes (Signed)
 Patient is resting comfortably.

## 2024-03-10 NOTE — Discharge Instructions (Addendum)
 Take the medication as needed for pain.  The medication can cause problems with constipation.  Consider taking a stool softener.  Call Dr. Prudence Brown office to schedule a follow-up appointment.

## 2024-03-10 NOTE — ED Provider Notes (Signed)
 [place] EMERGENCY DEPARTMENT AT [facility] Provider Note   CSN: [id number] Arrival date & time: 03/10/24  1704     Patient presents with: Chief complaint: Shoulder injury  Melissa Lane is a 89 y.o. female.   HPI   Patient has history of hypertension hyperlipidemia neuropathy palpitations.  Patient states she was walking outside on to a wet deck.  Patient slipped and fell injuring her shoulder.  Prior to Admission medications   Medication Sig Start Date End Date Taking? Authorizing Provider  oxyCODONE-acetaminophen  (PERCOCET/ROXICET) 5-325 MG tablet Take 1 tablet by mouth every 6 (six) hours as needed for severe pain (pain score 7-10). 03/10/24  Yes Trish Furl, MD  aspirin  EC 81 MG tablet Take 81 mg by mouth daily.    [provider]  atorvastatin  (LIPITOR) 40 MG tablet Take 40 mg by mouth daily.    [provider]  baclofen  (LIORESAL ) 10 MG tablet Take 1 tablet (10 mg total) by mouth 3 (three) times daily. 10/28/23   Levora Reas A, NP  cephALEXin  (KEFLEX ) 500 MG capsule Take 1 capsule (500 mg total) by mouth 2 (two) times daily. 10/27/23   Zuleta, Kaitlin G, NP  Clobetasol  Prop Emollient Base (CLOBETASOL  PROPIONATE E) 0.05 % emollient cream Apply 1 Application topically at bedtime. 11/17/23   Zuleta, Kaitlin G, NP  dicyclomine (BENTYL) 20 MG tablet Take 20 mg by mouth 4 (four) times daily -  before meals and at bedtime. As needed 01/21/24   [provider]  estradiol  (ESTRING ) 7.5 MCG/24HR vaginal ring Place 1 each vaginally every 3 (three) months. 02/22/24   Zuleta, Kaitlin G, NP  famotidine (PEPCID) 20 MG tablet 1 tablet at bedtime as needed Orally Once a day for 30 days 01/13/24   [provider]  Fluocinolone Acetonide 0.01 % OIL Use several drops in ear canal as needed for itching *use no longer than a week at a time* 04/14/23   [provider]  hydrocortisone  2.5 % lotion Apply topically 2 (two) times daily. Left external ear  09/03/18   Vance Gell, MD  losartan -hydrochlorothiazide  (HYZAAR) 100-12.5 MG per tablet Take 1 tablet by mouth daily.    [provider]  meloxicam (MOBIC) 7.5 MG tablet Take 7.5 mg by mouth daily.    [provider]  metoprolol  succinate (TOPROL -XL) 25 MG 24 hr tablet Take 1 tablet (25 mg total) by mouth daily. NEED OV. 07/10/23   Eilleen Grates, MD  neomycin -polymyxin-hydrocortisone  (CORTISPORIN) 3.5-10000-1 OTIC suspension Place 4 drops into the left ear 3 (three) times daily. 09/03/18   Vance Gell, MD  ondansetron  (ZOFRAN ) 4 MG tablet Take 4 mg by mouth every 8 (eight) hours as needed. 01/13/24   [provider]    Allergies: Ciprofloxacin, Macrodantin [nitrofurantoin macrocrystal], Metronidazole, Naproxen, Vitamin d (calciferol), and Bactrim  [sulfamethoxazole -trimethoprim ]    Review of Systems  Updated Vital Signs Ht 1.6 m (5' 3)   Wt 90.7 kg   BMI 35.43 kg/m   Physical Exam Vitals and nursing note reviewed.  Constitutional:      General: She is not in acute distress.    Appearance: She is well-developed.  HENT:     Head: Normocephalic and atraumatic.     Right Ear: External ear normal.     Left Ear: External ear normal.   Eyes:     General: No scleral icterus.       Right eye: No discharge.        Left eye: No  discharge.     Conjunctiva/sclera: Conjunctivae normal.   Neck:     Trachea: No tracheal deviation.   Cardiovascular:     Rate and Rhythm: Normal rate.  Pulmonary:     Effort: Pulmonary effort is normal. No respiratory distress.     Breath sounds: No stridor.  Abdominal:     General: There is no distension.   Musculoskeletal:        General: Tenderness present. No swelling or deformity.     Right shoulder: Normal.     Left shoulder: Tenderness and bony tenderness present. Decreased range of motion.     Cervical back: Normal and neck supple.     Thoracic back: Normal.     Lumbar back: Normal.     Comments: No  tenderness to palpation bilateral lower extremities, no tenderness palpation right upper extremity,   Skin:    General: Skin is warm and dry.     Findings: No rash.   Neurological:     Mental Status: She is alert. Mental status is at baseline.     Cranial Nerves: No dysarthria or facial asymmetry.     Motor: No seizure activity.     (all labs ordered are listed, but only abnormal results are displayed) Labs Reviewed - No data to display  EKG: None  Radiology: DG Shoulder Left Result Date: 03/10/2024 CLINICAL DATA:  Fall with pain EXAM: LEFT SHOULDER - 2+ VIEW COMPARISON:  None Available. FINDINGS: Comminuted fracture involving the left humeral neck and greater tuberosity with close to 1/2 shaft diameter anterior displacement of the shaft. Humeral head projects over the glenoid fossa. Probable 3.1 cm displaced fracture fragment along the inferior joint space. IMPRESSION: Comminuted displaced fracture of the left humeral neck and greater tuberosity. Suspected 3.1 cm displaced fracture fragment inferior to the glenohumeral joint Electronically Signed   By: Esmeralda Hedge M.D.   On: 03/10/2024 18:35     Procedures   Medications Ordered in the ED  oxyCODONE-acetaminophen  (PERCOCET/ROXICET) 5-325 MG per tablet 1 tablet (1 tablet Oral Given 03/10/24 1800)    Clinical Course as of 03/10/24 1914  Thu Mar 10, 2024  1846 Shoulder x-ray shows a comminuted displaced fracture left humeral neck and greater tuberosity.  No evidence of dislocation [JK]    Clinical Course User Index [JK] Trish Furl, MD                                 Medical Decision Making Problems Addressed: Closed displaced fracture of surgical neck of left humerus, unspecified fracture morphology, initial encounter: acute illness or injury that poses a threat to life or bodily functions  Amount and/or Complexity of Data Reviewed Radiology: ordered and independent interpretation performed.  Risk Prescription drug  management.   Patient presented to the ED for evaluation of a shoulder injury.  Patient slipped and fell landing on her left shoulder.  X-rays show a displaced left humeral neck and greater tuberosity fracture.  Patient does not have any other injuries.  She is neurovascularly intact.  No dislocation.  Patient was placed in a sling and given medications for pain.  Will discharge home with orthopedic follow-up.  Patient has seen Dr. Deeann Fare in the past.     Final diagnoses:  Closed displaced fracture of surgical neck of left humerus, unspecified fracture morphology, initial encounter    ED Discharge Orders          Ordered  oxyCODONE-acetaminophen  (PERCOCET/ROXICET) 5-325 MG tablet  Every 6 hours PRN        03/10/24 1913               Trish Furl, MD 03/10/24 [phone]

## 2024-03-14 DIAGNOSIS — M25512 Pain in left shoulder: Secondary | ICD-10-CM | POA: Diagnosis not present

## 2024-03-14 DIAGNOSIS — S4292XA Fracture of left shoulder girdle, part unspecified, initial encounter for closed fracture: Secondary | ICD-10-CM | POA: Diagnosis not present

## 2024-03-16 DIAGNOSIS — S40812D Abrasion of left upper arm, subsequent encounter: Secondary | ICD-10-CM | POA: Diagnosis not present

## 2024-03-16 DIAGNOSIS — Z6834 Body mass index (BMI) 34.0-34.9, adult: Secondary | ICD-10-CM | POA: Diagnosis not present

## 2024-03-16 DIAGNOSIS — Z23 Encounter for immunization: Secondary | ICD-10-CM | POA: Diagnosis not present

## 2024-03-16 DIAGNOSIS — S42355P Nondisplaced comminuted fracture of shaft of humerus, left arm, subsequent encounter for fracture with malunion: Secondary | ICD-10-CM | POA: Diagnosis not present

## 2024-03-28 DIAGNOSIS — M25512 Pain in left shoulder: Secondary | ICD-10-CM | POA: Diagnosis not present

## 2024-04-17 ENCOUNTER — Other Ambulatory Visit: Payer: Self-pay | Admitting: Cardiology

## 2024-04-18 DIAGNOSIS — M25512 Pain in left shoulder: Secondary | ICD-10-CM | POA: Diagnosis not present

## 2024-04-20 DIAGNOSIS — M25612 Stiffness of left shoulder, not elsewhere classified: Secondary | ICD-10-CM | POA: Diagnosis not present

## 2024-04-25 DIAGNOSIS — M25612 Stiffness of left shoulder, not elsewhere classified: Secondary | ICD-10-CM | POA: Diagnosis not present

## 2024-04-27 DIAGNOSIS — M25612 Stiffness of left shoulder, not elsewhere classified: Secondary | ICD-10-CM | POA: Diagnosis not present

## 2024-04-28 ENCOUNTER — Ambulatory Visit (INDEPENDENT_AMBULATORY_CARE_PROVIDER_SITE_OTHER): Payer: Medicare Other | Admitting: Obstetrics and Gynecology

## 2024-04-28 VITALS — BP 166/92 | HR 76

## 2024-04-28 DIAGNOSIS — N390 Urinary tract infection, site not specified: Secondary | ICD-10-CM

## 2024-04-28 DIAGNOSIS — Z87440 Personal history of urinary (tract) infections: Secondary | ICD-10-CM

## 2024-04-28 DIAGNOSIS — I10 Essential (primary) hypertension: Secondary | ICD-10-CM | POA: Diagnosis not present

## 2024-04-28 MED ORDER — FOSFOMYCIN TROMETHAMINE 3 G PO PACK: 3.0000 g | PACK | ORAL | 5 refills | Status: AC

## 2024-04-28 NOTE — Progress Notes (Signed)
 Melissa Lane  SUBJECTIVE  History of Present Illness: Melissa Lane is a 89 y.o. female seen in follow-up for rUTI. Plan at last Lane was continue methenamine . She has has 1 UTI in the last 6 months that was treated with Fosfomycin and she has not been symptomatic since.    She reports she does not recall when she stopped taking methenamine .   Past Medical History: Patient  has a past medical history of Arthritis, Coronary artery calcification seen on CAT scan (2016), Hyperlipidemia, Hypertension, Peri-rectal abscess (2013), and SVT (supraventricular tachycardia) (HCC) (05/2018).   Past Surgical History: She  has a past surgical history that includes Abdominal hysterectomy (1978); Cataract extraction; Breast biopsy (Left, 07/26/1999); and Total shoulder arthroplasty (Right, 08/26/2018).   Medications: She has a current medication list which includes the following prescription(s): aspirin  ec, atorvastatin , baclofen , cephalexin , clobetasol  propionate e, dicyclomine, estradiol , famotidine, fluocinolone acetonide, fosfomycin, hydrocortisone , losartan -hydrochlorothiazide , meloxicam, metoprolol  succinate, neomycin -polymyxin-hydrocortisone , ondansetron , and oxycodone -acetaminophen .   Allergies: Patient is allergic to ciprofloxacin, macrodantin [nitrofurantoin macrocrystal], metronidazole, naproxen, vitamin d (calciferol), and bactrim  [sulfamethoxazole -trimethoprim ].   Social History: Patient  reports that she has never smoked. She has never used smokeless tobacco. She reports current alcohol use of about 1.0 - 2.0 standard drink of alcohol per week. She reports that she does not use drugs.     OBJECTIVE     Physical Exam: Vitals:   04/28/24 1049 04/28/24 1145  BP: (!) 194/84 (!) 166/92  Pulse: 76    Gen: No apparent distress, A&O x 3.  Detailed Urogynecologic Evaluation:  Deferred.    ASSESSMENT AND PLAN    Melissa Lane is a 89 y.o. with:  1. Recurrent UTI    2. Elevated blood pressure reading in office with diagnosis of hypertension     Recurrent UTI -     Fosfomycin Tromethamine ; Take 3 g by mouth 1 day or 1 dose for 1 dose. Take 1 packet every 30 days. Dissolve packet in 3-4 oz of water and drink immediately.  Dispense: 3 each; Refill: 5 -Patient has not been taking Methenamine  and has had x1 UTI in 6 months. My main concern is the domino effect of potential UTI and falls with her decreased core strength and balance. We discussed doing PT for her core and balance once she is done with her arm PT. She is open to this. I would also like her to have Fosfomycin at home in case of UTI on a weekend. We discussed that she should still come in for cultures to be done during the regular week if she has symptoms but she will have an emergency does of fosfomycin at home if needed. Patient is unable to apply vaginal estrogen cream as she currently has a broken left arm and arthritis in the right hand. Insurance denied coverage of e-string.   Elevated BP: Manual blood pressure was still elevated. Will plan for patient to follow up with PCP. Denies vision changes, headaches, or other stroke symptoms.   Patient to return in 6 months or sooner if needed for UTI evaluation.      Annaleigha Woo G Erion Hermans, NP

## 2024-05-03 DIAGNOSIS — M25612 Stiffness of left shoulder, not elsewhere classified: Secondary | ICD-10-CM | POA: Diagnosis not present

## 2024-05-05 DIAGNOSIS — M25612 Stiffness of left shoulder, not elsewhere classified: Secondary | ICD-10-CM | POA: Diagnosis not present

## 2024-05-10 DIAGNOSIS — M25612 Stiffness of left shoulder, not elsewhere classified: Secondary | ICD-10-CM | POA: Diagnosis not present

## 2024-05-12 DIAGNOSIS — M25612 Stiffness of left shoulder, not elsewhere classified: Secondary | ICD-10-CM | POA: Diagnosis not present

## 2024-05-16 DIAGNOSIS — M25512 Pain in left shoulder: Secondary | ICD-10-CM | POA: Diagnosis not present

## 2024-05-17 DIAGNOSIS — N289 Disorder of kidney and ureter, unspecified: Secondary | ICD-10-CM | POA: Diagnosis not present

## 2024-05-17 DIAGNOSIS — I10 Essential (primary) hypertension: Secondary | ICD-10-CM | POA: Diagnosis not present

## 2024-05-17 DIAGNOSIS — D1803 Hemangioma of intra-abdominal structures: Secondary | ICD-10-CM | POA: Diagnosis not present

## 2024-05-17 DIAGNOSIS — M25612 Stiffness of left shoulder, not elsewhere classified: Secondary | ICD-10-CM | POA: Diagnosis not present

## 2024-05-17 DIAGNOSIS — I700 Atherosclerosis of aorta: Secondary | ICD-10-CM | POA: Diagnosis not present

## 2024-05-17 DIAGNOSIS — K5900 Constipation, unspecified: Secondary | ICD-10-CM | POA: Diagnosis not present

## 2024-05-18 DIAGNOSIS — N1831 Chronic kidney disease, stage 3a: Secondary | ICD-10-CM | POA: Diagnosis not present

## 2024-05-18 DIAGNOSIS — R350 Frequency of micturition: Secondary | ICD-10-CM | POA: Diagnosis not present

## 2024-05-19 DIAGNOSIS — M25612 Stiffness of left shoulder, not elsewhere classified: Secondary | ICD-10-CM | POA: Diagnosis not present

## 2024-05-20 ENCOUNTER — Ambulatory Visit

## 2024-05-20 ENCOUNTER — Other Ambulatory Visit (HOSPITAL_COMMUNITY)
Admission: RE | Admit: 2024-05-20 | Discharge: 2024-05-20 | Disposition: A | Discharge status: Home / Self Care | Source: Other Acute Inpatient Hospital | Attending: Obstetrics and Gynecology | Admitting: Obstetrics and Gynecology

## 2024-05-20 VITALS — BP 165/73 | HR 68

## 2024-05-20 DIAGNOSIS — R82998 Other abnormal findings in urine: Secondary | ICD-10-CM | POA: Diagnosis not present

## 2024-05-20 DIAGNOSIS — R350 Frequency of micturition: Secondary | ICD-10-CM | POA: Diagnosis not present

## 2024-05-20 LAB — POCT URINALYSIS DIP (CLINITEK)
Bilirubin, UA: NEGATIVE
Glucose, UA: NEGATIVE mg/dL
Nitrite, UA: NEGATIVE
Spec Grav, UA: 1.025 (ref 1.010–1.025)
Urobilinogen, UA: 0.2 U/dL
pH, UA: 5.5 (ref 5.0–8.0)

## 2024-05-20 NOTE — Patient Instructions (Signed)
Your Urine dip that was done in office was Positive. I am sending the urine off for culture and you can take AZO over the counter for your discomfort.  We will contact you when the results are back between 3-5 days. If a different antibiotic is needed we will sent the order to the pharmacy and you will be notified. If you have any questions or concerns please feel free to call us at [phone]

## 2024-05-20 NOTE — Progress Notes (Signed)
 Melissa Lane is a 89 y.o. female  arrived today with UTI sx.  Per Dr. Susen protocol: A urine specimen was collected and POCT Urine was done and urine culture sent to the lab. POCT Urine was POSITIVE for Leukocytes

## 2024-05-21 LAB — URINE CULTURE: Culture: <10000 — AB

## 2024-05-24 ENCOUNTER — Other Ambulatory Visit: Payer: Self-pay | Admitting: Obstetrics and Gynecology

## 2024-05-24 ENCOUNTER — Ambulatory Visit: Payer: Self-pay | Admitting: Obstetrics and Gynecology

## 2024-05-24 ENCOUNTER — Telehealth: Payer: Self-pay | Admitting: Obstetrics and Gynecology

## 2024-05-24 DIAGNOSIS — R3915 Urgency of urination: Secondary | ICD-10-CM

## 2024-05-24 MED ORDER — TROSPIUM CHLORIDE ER 60 MG PO CP24: 60.0000 mg | ORAL_CAPSULE | Freq: Every day | ORAL | 5 refills | Status: DC

## 2024-05-24 NOTE — Progress Notes (Signed)
 Patient still having frequency and urgency. No UTI on culture, will treat as OAB. Trospium  60mg  ER daily sent t pharmacy.

## 2024-05-24 NOTE — Telephone Encounter (Signed)
 Pt came in last week and gave urine sample and said she feels like she still has UTI symptoms and needs something called in.

## 2024-05-31 ENCOUNTER — Encounter: Payer: Self-pay | Admitting: Obstetrics and Gynecology

## 2024-05-31 ENCOUNTER — Ambulatory Visit (INDEPENDENT_AMBULATORY_CARE_PROVIDER_SITE_OTHER): Admitting: Obstetrics and Gynecology

## 2024-05-31 VITALS — BP 113/72 | HR 66

## 2024-05-31 DIAGNOSIS — R3915 Urgency of urination: Secondary | ICD-10-CM | POA: Diagnosis not present

## 2024-05-31 DIAGNOSIS — R440 Auditory hallucinations: Secondary | ICD-10-CM | POA: Diagnosis not present

## 2024-05-31 DIAGNOSIS — R300 Dysuria: Secondary | ICD-10-CM | POA: Diagnosis not present

## 2024-05-31 DIAGNOSIS — R350 Frequency of micturition: Secondary | ICD-10-CM | POA: Diagnosis not present

## 2024-05-31 DIAGNOSIS — N3941 Urge incontinence: Secondary | ICD-10-CM

## 2024-05-31 DIAGNOSIS — R413 Other amnesia: Secondary | ICD-10-CM | POA: Diagnosis not present

## 2024-05-31 DIAGNOSIS — R319 Hematuria, unspecified: Secondary | ICD-10-CM | POA: Diagnosis not present

## 2024-05-31 DIAGNOSIS — R82998 Other abnormal findings in urine: Secondary | ICD-10-CM | POA: Diagnosis not present

## 2024-05-31 DIAGNOSIS — H6502 Acute serous otitis media, left ear: Secondary | ICD-10-CM | POA: Diagnosis not present

## 2024-05-31 DIAGNOSIS — R441 Visual hallucinations: Secondary | ICD-10-CM | POA: Diagnosis not present

## 2024-05-31 LAB — POCT URINALYSIS DIP (CLINITEK)
Blood, UA: NEGATIVE
Glucose, UA: NEGATIVE mg/dL
Ketones, POC UA: NEGATIVE mg/dL
Nitrite, UA: NEGATIVE
POC PROTEIN,UA: 30 — AB
Spec Grav, UA: 1.02 (ref 1.010–1.025)
Urobilinogen, UA: 0.2 U/dL
pH, UA: 5.5 (ref 5.0–8.0)

## 2024-05-31 MED ORDER — VIBEGRON 75 MG PO TABS: 75.0000 mg | ORAL_TABLET | Freq: Every day | ORAL | 5 refills | Status: DC

## 2024-05-31 NOTE — Progress Notes (Signed)
 [facility] Return Visit  SUBJECTIVE  History of Present Illness: Melissa Lane is a 89 y.o. female seen in follow-up for rUTI symptoms, urinary frequency/urgency and dark colored urine.   Patient reports she has been having auditory and visual hallucinations for the last 3 months since she fell and was seen in the ER.   Patient denies burning or pain with urination but reports increased urgency/frequency and leakage.   Patient also endorses in the last two months she has been having issues with her memory and has concerns over her cognition. She denies having a neurologist. Patient is also being monitored for a lesion on her liver.    Past Medical History: Patient  has a past medical history of Arthritis, Coronary artery calcification seen on CAT scan (2016), Hyperlipidemia, Hypertension, Peri-rectal abscess (2013), and SVT (supraventricular tachycardia) (HCC) (05/2018).   Past Surgical History: She  has a past surgical history that includes Abdominal hysterectomy (1978); Cataract extraction; Breast biopsy (Left, 07/26/1999); and Total shoulder arthroplasty (Right, 08/26/2018).   Medications: She has a current medication list which includes the following prescription(s): aspirin  ec, atorvastatin , baclofen , cephalexin , clobetasol  propionate e, dicyclomine, estradiol , famotidine, fluocinolone acetonide, hydrocortisone , losartan -hydrochlorothiazide , meloxicam, metoprolol  succinate, neomycin -polymyxin-hydrocortisone , ondansetron , oxycodone -acetaminophen , and vibegron .   Allergies: Patient is allergic to ciprofloxacin, macrodantin [nitrofurantoin macrocrystal], metronidazole, naproxen, vitamin d (calciferol), and bactrim  [sulfamethoxazole -trimethoprim ].   Social History: Patient  reports that she has never smoked. She has never used smokeless tobacco. She reports current alcohol use of about 1.0 - 2.0 standard drink of alcohol per week. She reports that she does not use drugs.      OBJECTIVE     Physical Exam: Vitals:   05/31/24 1300  BP: 113/72  Pulse: 66   Gen: No apparent distress, A&O x 3.  Detailed Urogynecologic Evaluation:  Deferred. Straight cath preformed without difficulty.    ASSESSMENT AND PLAN    Ms. Mcglaun is a 89 y.o. with:  1. Urgency incontinence   2. Dark urine   3. Urinary frequency   4. Dysuria   5. Memory impairment   6. Auditory hallucinations   7. Visual hallucinations    Patient was unable to get Trospium  as it is not covered by insurance. She is not a good candidate for other anticholingeric medications due to her age and the risk of cognitive side effects. Patient is on Metoprolol  and is not a good candidate for Myrbetriq. Gemtesa  75mg  daily sent in for patient for her oab symptoms and samples given to patient.  Urine will be sent for culture but is not indicative of infection based on urine dip alone. It is positive for both protein and bilirubin which could be the reason for the dark color. CMP ordered to evaluate kidney function and electrolytes. Urine sent for culture, will start on Gemtesa  75mg  daily.  Patient denies painful urination at this time.  Patient has concerns for new memory impairment that she equates back to her fall over a month ago. She did not have head imagining at that time. She notably has trouble remembering what she was saying the middle of a sentence. She questioned if this could be UTI delirium, but we discussed with negative cultures, no fevers or other signs of systemic infection, I do not feel like this is a plausible diagnosis.  With the combination of auditory hallucinations, visual hallucinations, and memory changes, I think it pertinent to start with imaging and consider neurology referral. CT head w/wo contrast ordered. I will order initial imaging  and reach out to her PCP and fax notes to coordinate care. PCP is with [facility] so information will need to be faxed and will plan to call the office.    Patient to return in 6 weeks or sooner if needed for medication re-eval.    Juanna Pudlo G Cylinda Ignasiak, NP

## 2024-05-31 NOTE — Patient Instructions (Addendum)
 We will start the Gemtesa  75mg  daily for urgency and frequency.   Please let me know about the CT if you feel it is necessary  I want you to get your blood drawn today to check your kidney function

## 2024-05-31 NOTE — Progress Notes (Signed)
 p

## 2024-06-01 LAB — COMPREHENSIVE METABOLIC PANEL WITH GFR
ALT: 16 IU/L (ref 0–32)
AST: 19 IU/L (ref 0–40)
Albumin: 4.8 g/dL — ABNORMAL HIGH (ref 3.7–4.7)
Alkaline Phosphatase: 165 IU/L — ABNORMAL HIGH (ref 44–121)
BUN/Creatinine Ratio: 20 (ref 12–28)
BUN: 19 mg/dL (ref 8–27)
Bilirubin Total: 0.6 mg/dL (ref 0.0–1.2)
CO2: 24 mmol/L (ref 20–29)
Calcium: 10.1 mg/dL (ref 8.7–10.3)
Chloride: 100 mmol/L (ref 96–106)
Creatinine, Ser: 0.93 mg/dL (ref 0.57–1.00)
Globulin, Total: 2.5 g/dL (ref 1.5–4.5)
Glucose: 112 mg/dL — ABNORMAL HIGH (ref 70–99)
Potassium: 4.8 mmol/L (ref 3.5–5.2)
Sodium: 142 mmol/L (ref 134–144)
Total Protein: 7.3 g/dL (ref 6.0–8.5)
eGFR: 59 mL/min/1.73 — ABNORMAL LOW (ref >59)

## 2024-06-01 NOTE — Progress Notes (Unsigned)
 Gemtesa  has been approved at $230.00 monthly.   ([employer]) PA Case ID #: [id number] Rx #: [id number]  Will try a tier exception to get the price lowered.

## 2024-06-03 ENCOUNTER — Ambulatory Visit: Payer: Self-pay | Admitting: Obstetrics and Gynecology

## 2024-06-03 ENCOUNTER — Other Ambulatory Visit: Payer: Self-pay | Admitting: Obstetrics and Gynecology

## 2024-06-03 DIAGNOSIS — N3000 Acute cystitis without hematuria: Secondary | ICD-10-CM

## 2024-06-03 MED ORDER — DOXYCYCLINE HYCLATE 100 MG PO CAPS: 100.0000 mg | ORAL_CAPSULE | Freq: Two times a day (BID) | ORAL | 0 refills | Status: DC

## 2024-06-03 NOTE — Progress Notes (Signed)
 Called and spoke to patient on the phone. We had sent her urine for pathnostics. Results below.   Alloscardovia omnicolens >=100,000 cells/mL  Klebsiella pneumoniae >=100,000 cells/mL  Actinotignum schaalii [phone] cells/mL  Discussed with patient the results of the culture as it is resistant to multiple antibiotic treatments. Will send in Doxycycline  to treat all pathogens noted per Pathnostics suggestions. Pathnostics to be uploaded into patient chart.

## 2024-06-04 ENCOUNTER — Ambulatory Visit (HOSPITAL_BASED_OUTPATIENT_CLINIC_OR_DEPARTMENT_OTHER)
Admission: RE | Admit: 2024-06-04 | Discharge: 2024-06-04 | Disposition: A | Discharge status: Home / Self Care | Source: Ambulatory Visit | Attending: Obstetrics and Gynecology | Admitting: Obstetrics and Gynecology

## 2024-06-04 DIAGNOSIS — R441 Visual hallucinations: Secondary | ICD-10-CM

## 2024-06-04 DIAGNOSIS — R440 Auditory hallucinations: Secondary | ICD-10-CM

## 2024-06-04 DIAGNOSIS — R413 Other amnesia: Secondary | ICD-10-CM

## 2024-06-04 MED ORDER — IOHEXOL 300 MG/ML  SOLN
75.0000 mL | Freq: Once | INTRAMUSCULAR | Status: AC | PRN
  Administered 2024-06-04: 75 mL via INTRAVENOUS

## 2024-06-09 ENCOUNTER — Encounter: Payer: Self-pay | Admitting: Gastroenterology

## 2024-06-21 ENCOUNTER — Encounter: Payer: Self-pay | Admitting: Obstetrics and Gynecology

## 2024-06-21 ENCOUNTER — Ambulatory Visit: Admitting: Obstetrics and Gynecology

## 2024-06-21 DIAGNOSIS — R350 Frequency of micturition: Secondary | ICD-10-CM | POA: Diagnosis not present

## 2024-06-21 DIAGNOSIS — N3941 Urge incontinence: Secondary | ICD-10-CM | POA: Diagnosis not present

## 2024-06-21 MED ORDER — VIBEGRON 75 MG PO TABS
75.0000 mg | ORAL_TABLET | Freq: Every day | ORAL | 5 refills | Status: AC
Start: 2024-06-21 — End: ?

## 2024-06-21 NOTE — Patient Instructions (Addendum)
 Continue Gemtesa  75mg  daily for the overactive bladder. Please call me if the medication is too expensive.

## 2024-06-21 NOTE — Progress Notes (Signed)
 [facility] Return Visit  SUBJECTIVE  History of Present Illness: Melissa Lane is a 89 y.o. female seen in follow-up for OAB. Plan at last visit was start Vibegron  75mg  daily. She reports it has made a world of difference in her quality of life. She reports she has been sleeping through the night and is able to make it to the bathroom without significant urgency.    Past Medical History: Patient  has a past medical history of Arthritis, Coronary artery calcification seen on CAT scan (2016), Hyperlipidemia, Hypertension, Peri-rectal abscess (2013), and SVT (supraventricular tachycardia) (05/2018).   Past Surgical History: She  has a past surgical history that includes Abdominal hysterectomy (1978); Cataract extraction; Breast biopsy (Left, 07/26/1999); and Total shoulder arthroplasty (Right, 08/26/2018).   Medications: She has a current medication list which includes the following prescription(s): aspirin  ec, atorvastatin , baclofen , cephalexin , clobetasol  propionate e, dicyclomine, doxycycline , estradiol , famotidine, fluocinolone acetonide, hydrocortisone , losartan -hydrochlorothiazide , meloxicam, metoprolol  succinate, neomycin -polymyxin-hydrocortisone , ondansetron , oxycodone -acetaminophen , and vibegron .   Allergies: Patient is allergic to ciprofloxacin, macrodantin [nitrofurantoin macrocrystal], metronidazole, naproxen, vitamin d (calciferol), and bactrim  [sulfamethoxazole -trimethoprim ].   Social History: Patient  reports that she has never smoked. She has never used smokeless tobacco. She reports current alcohol use of about 1.0 - 2.0 standard drink of alcohol per week. She reports that she does not use drugs.     OBJECTIVE     Physical Exam: Vitals:   06/21/24 1145 06/21/24 1213  BP: (!) 153/79 (!) 140/79  Pulse: 73 60   Gen: No apparent distress, A&O x 3.  Detailed Urogynecologic Evaluation:  Deferred.    ASSESSMENT AND PLAN    Ms. Melissa Lane is a 89 y.o. with:  1.  Urgency incontinence   2. Urinary frequency    Continue Gemtesa  75mg  daily. Patient to call if any concerns of UTI or if she has other Uro-Gyn concerns.   Patient to follow up in 3 months or sooner if needed.   Marqueze Ramcharan G Tagg Eustice, NP

## 2024-07-01 DIAGNOSIS — Z23 Encounter for immunization: Secondary | ICD-10-CM | POA: Diagnosis not present

## 2024-07-04 ENCOUNTER — Encounter: Payer: Self-pay | Admitting: Obstetrics and Gynecology

## 2024-07-08 ENCOUNTER — Encounter: Payer: Self-pay | Admitting: Gastroenterology

## 2024-07-08 ENCOUNTER — Ambulatory Visit (INDEPENDENT_AMBULATORY_CARE_PROVIDER_SITE_OTHER): Admitting: Gastroenterology

## 2024-07-08 VITALS — BP 174/78 | HR 64 | Ht 63.0 in | Wt 202.0 lb

## 2024-07-08 DIAGNOSIS — Z860101 Personal history of adenomatous and serrated colon polyps: Secondary | ICD-10-CM | POA: Diagnosis not present

## 2024-07-08 DIAGNOSIS — D1803 Hemangioma of intra-abdominal structures: Secondary | ICD-10-CM

## 2024-07-08 DIAGNOSIS — K769 Liver disease, unspecified: Secondary | ICD-10-CM | POA: Diagnosis not present

## 2024-07-08 DIAGNOSIS — R1032 Left lower quadrant pain: Secondary | ICD-10-CM

## 2024-07-08 MED ORDER — HYOSCYAMINE SULFATE SL 0.125 MG SL SUBL: SUBLINGUAL_TABLET | SUBLINGUAL | 0 refills | Status: DC

## 2024-07-08 NOTE — Patient Instructions (Signed)
 We have sent the following medications to your pharmacy for you to pick up at your convenience: Levsin.  We will obtain your previous colonoscopy report from Dr. Marshell office.   No further imaging needed of liver hemangiomas.   _______________________________________________________  If your blood pressure at your visit was 140/90 or greater, please contact your primary care physician to follow up on this.  _______________________________________________________  If you are age 88 or older, your body mass index should be between 23-30. Your Body mass index is 35.78 kg/m. If this is out of the aforementioned range listed, please consider follow up with your Primary Care Provider.  If you are age 20 or younger, your body mass index should be between 19-25. Your Body mass index is 35.78 kg/m. If this is out of the aformentioned range listed, please consider follow up with your Primary Care Provider.   ________________________________________________________  The [facility] providers would like to encourage you to use MYCHART to communicate with providers for non-urgent requests or questions.  Due to long hold times on the telephone, sending your provider a message by [facility] may be a faster and more efficient way to get a response.  Please allow 48 business hours for a response.  Please remember that this is for non-urgent requests.  _______________________________________________________  [facility] is using a team-based approach to care.  Your team is made up of your doctor and two to three APPS. Our APPS (Nurse Practitioners and Physician Assistants) work with your physician to ensure care continuity for you. They are fully qualified to address your health concerns and develop a treatment plan. They communicate directly with your gastroenterologist to care for you. Seeing the Advanced Practice Practitioners on your physician's team can help you by facilitating care more  promptly, often allowing for earlier appointments, access to diagnostic testing, procedures, and other specialty referrals.

## 2024-07-08 NOTE — Progress Notes (Unsigned)
 GASTROENTEROLOGY OUTPATIENT CLINIC VISIT   Primary Care Provider Melissa Pfeiffer, PA-C [address] [place] [address] [phone]  Referring Provider Melissa Pfeiffer, PA-C [address] [place],  [place] [address] [phone]  Patient Profile: Melissa Lane is a 89 y.o. female with a pmh significant for hypertension, hyperlipidemia, arthritis, status post hysterectomy, liver hemangioma.  The patient presents to the [facility] for an evaluation and management of problem(s) noted below:  Problem List 1. Liver lesion   2. Liver hemangioma   3. Abdominal pain, left lower quadrant   4. Hx of adenomatous colonic polyps     Discussed the use of AI scribe software for clinical note transcription with the patient, who gave verbal consent to proceed.  History of Present Illness Melissa Lane is an 89 year old female who presents for new patient evaluation with liver lesions and intermittent LLQ abdominal discomfort.  She was referred by her primary care doctor [facility].  She experiences intermittent abdominal pain and has had alternating bowel habits for some time period.  Constipation/diarrhea occur.  Dulcolax helps her if she has too significant constipation.  Her stool is often mustard-colored.  No blood has been noted.  These have been longer standing issues.  She has had a colonoscopy in 2021 by Dr. Luis and had a TA and Fibroepithelial polyps found.  We do not have the actual colonoscopy report, however.  She recalls a past diagnosis of diverticulosis and mentions a possible history of an episode of diverticulitis with antibiotic treatment.  Currently she is doing well and is completely pain-free and cannot recall the exact time of last pain.  She would like to remain healthy.  She cannot recall if the pain is sharp/stabbing or dull.  She does recall not having fevers though.  Earlier this year in workup of her pains, she had a CT scan done  through [facility] and then a Liver MRI.  She was found to have a liver hemangioma.  She does not have any upper or RUQ abdominal pains.  No dyspeptic symptoms.  No heartburn.  She cannot recall a prior EGD ever being done.   GI Review of Systems Positive as above Negative for dysphagia, odynophagia  Review of Systems General: Denies fevers/chills/weight loss unintentionally Cardiovascular: Denies chest pain Pulmonary: Denies shortness of breath Gastroenterological: See HPI Genitourinary: Denies darkened urine Hematological: Denies easy bruising/bleeding Dermatological: Denies jaundice Psychological: Mood is stable  Medications Current Outpatient Medications  Medication Sig Dispense Refill   atorvastatin  (LIPITOR) 40 MG tablet Take 40 mg by mouth daily.     Hyoscyamine Sulfate SL (LEVSIN/SL) 0.125 MG SUBL Take one tablet under tongue twice daily as needed for left lower abdominal pain 15 tablet 0   losartan -hydrochlorothiazide  (HYZAAR) 100-12.5 MG tablet Take 1 tablet by mouth every morning.     meloxicam (MOBIC) 7.5 MG tablet Take 7.5 mg by mouth daily.     metoprolol  succinate (TOPROL -XL) 25 MG 24 hr tablet Take 1 tablet (25 mg total) by mouth daily. (schedule an office visit). 90 tablet 0   No current facility-administered medications for this visit.    Allergies Allergies  Allergen Reactions   Ciprofloxacin Swelling   Macrodantin [Nitrofurantoin Macrocrystal] Other (See Comments)    UNSPECIFIED REACTION    Metronidazole Other (See Comments)   Naproxen Other (See Comments)    UNSPECIFIED REACTION    Vitamin D (Calciferol) Other (See Comments)    UNSPECIFIED REACTION  Not otc but high dose of  rx   Bactrim  [Sulfamethoxazole -Trimethoprim ] Hives and Itching    Histories Past Medical History:  Diagnosis Date   Arthritis    Coronary artery calcification seen on CAT scan 2016   Hyperlipidemia    Hypertension    Peri-rectal abscess 2013   SVT (supraventricular tachycardia)  05/2018   Past Surgical History:  Procedure Laterality Date   ABDOMINAL HYSTERECTOMY  1978   Partial   BREAST BIOPSY Left 07/26/1999   CATARACT EXTRACTION     TOTAL SHOULDER ARTHROPLASTY Right 08/26/2018   Procedure: RIGHT TOTAL SHOULDER ARTHROPLASTY;  Surgeon: Melissa Soulier, MD;  Location: [facility] OR;  Service: Orthopedics;  Laterality: Right;   Social History   Socioeconomic History   Marital status: Widowed    Spouse name: Not on file   Number of children: 2   Years of education: Not on file   Highest education level: Not on file  Occupational History   Occupation: Retired housewife  Tobacco Use   Smoking status: Never   Smokeless tobacco: Never  Vaping Use   Vaping status: Never Used  Substance and Sexual Activity   Alcohol use: Yes    Alcohol/week: 1.0 - 2.0 standard drink of alcohol    Types: 1 - 2 Standard drinks or equivalent per week    Comment: occasionally.    Drug use: No   Sexual activity: Not Currently  Other Topics Concern   Not on file  Social History Narrative   Lives at home with husband.    Social Drivers of Corporate investment banker Strain: Not on file  Food Insecurity: Not on file  Transportation Needs: Not on file  Physical Activity: Not on file  Stress: Not on file  Social Connections: Not on file  Intimate Partner Violence: Not on file   Family History  Problem Relation Age of Onset   Alzheimer's disease Mother        died at 2   Breast cancer Mother            Heart attack Father 24       died at 27   Inflammatory bowel disease Son    Crohn's disease Son    Bladder Cancer Neg Hx    Uterine cancer Neg Hx    Renal cancer Neg Hx    Colon cancer Neg Hx    Esophageal cancer Neg Hx    Liver disease Neg Hx    Pancreatic cancer Neg Hx    Rectal cancer Neg Hx    Stomach cancer Neg Hx    I have reviewed her medical, social, and family history in detail and updated the electronic medical record as necessary.    PHYSICAL EXAMINATION   BP (!) 174/78 (BP Location: Right Arm, Patient Position: Sitting, Cuff Size: Normal)   Pulse 64   Ht 5' 3 (1.6 m)   Wt 202 lb (91.6 kg)   BMI 35.78 kg/m  Wt Readings from Last 3 Encounters:  07/08/24 202 lb (91.6 kg)  03/10/24 200 lb (90.7 kg)  05/27/23 215 lb 6.4 oz (97.7 kg)  GEN: NAD, appears stated age, doesn't appear chronically ill PSYCH: Cooperative, without pressured speech EYE: Conjunctivae pink, sclerae anicteric ENT: MMM CV: Nontachycardic RESP: No audible wheezing GI: NABS, soft, NT/ND, without rebound MSK/EXT: No significant lower extremity edema SKIN: No jaundice NEURO:  Alert & Oriented x 3, no focal deficits   REVIEW OF DATA  I reviewed the following data at the time of this encounter:  GI Procedures  and Studies  2021 colonoscopy pathology A.  DESCENDING COLON POLYP, POLYPECTOMY:       Tubular adenoma.  B.  ANAL POLYP, POLYPECTOMY:       Fibroepithelial polyp.       Negative for dysplasia.   Laboratory Studies  Reviewed those in epic  Imaging Studies  May 2025 MRI abdomen [facility] FINDINGS:  . Lower Chest: Small hiatal hernia.  .  Liver: Bilobar T2 hyperintense lesions without enhancement, the largest of which measures 2.1 cm in segment 8/4a. There is a subcapsular segment 3 mildly T2 hyperintense lesion which demonstrates early arterial and progressive enhancement  .  Gallbladder/Biliary: Within normal limits.  .  Spleen: Within normal limits.  .  Pancreas: Within normal limits.  .  Adrenals: Within normal limits.  .  Kidneys: Bilateral T2 hyperintense cortical and parapelvic renal lesions  .  Peritoneum/Mesenteries/Extraperitoneum: No significant ascites or lymphadenopathy.  .  Gastrointestinal tract: No evidence of obstruction.  .  Vascular: Within normal limits.  .  Musculoskeletal: Within normal limits.  IMPRESSION:  The hepatic lesion in question correlates with a flash filling hemangioma    ASSESSMENT/PLAN  Ms. Depass is a 89 y.o. female.   The patient is seen today for evaluation and management of:  1. Liver lesion   2. Liver hemangioma   3. Abdominal pain, left lower quadrant   4. Hx of adenomatous colonic polyps    The patient is clinically and hemodynamically stable.   Abdominal Pain Intermittent abdominal pain but currently pain-free.  Etiology unclear.  Will trial antispasmodic to see if helpful in future.  No significant other workup currently but could consider a colonoscopy if things persist with negative imaging. - Prescribe Levsin 0.125 mg sublingual, 15 tablets, to be used BID PRN for left lower quadrant pain (if not helpful then stop) - Monitor for recurrence of pain, especially severe persisting pain for hours/days which may require urgent CT scan to rule out diverticulitis - Colonoscopy could be considered but felt not necessary at this time.  History Adenomas - No plan for surveillance at this time (she has aged out)  Liver Lesion consistent with Hemangioma This was noted on CT and then on MRI confirmed to be a hemangioma.  No need for re-imaging unless symptoms develop. If re-imaging is considered, it should be an MRI-Abdomen. - No further imaging of liver hemangiomas unless symptoms develop         PLAN  There are no diagnoses linked to this encounter.   No orders of the defined types were placed in this encounter.   New Prescriptions   HYOSCYAMINE SULFATE SL (LEVSIN/SL) 0.125 MG SUBL    Take one tablet under tongue twice daily as needed for left lower abdominal pain   Modified Medications   No medications on file    Planned Follow Up No follow-ups on file.   Total Time in Face-to-Face and in Coordination of Care for patient including independent/personal interpretation/review of prior testing, medical history, examination, medication adjustment, communicating results with the patient directly, and documentation within the EHR is 35 minutes.   Aloha Finner, MD [place] [facility] Office # [phone]

## 2024-07-09 DIAGNOSIS — K769 Liver disease, unspecified: Secondary | ICD-10-CM | POA: Insufficient documentation

## 2024-07-09 DIAGNOSIS — Z860101 Personal history of adenomatous and serrated colon polyps: Secondary | ICD-10-CM | POA: Insufficient documentation

## 2024-07-09 DIAGNOSIS — D1803 Hemangioma of intra-abdominal structures: Secondary | ICD-10-CM | POA: Insufficient documentation

## 2024-07-09 DIAGNOSIS — R1032 Left lower quadrant pain: Secondary | ICD-10-CM | POA: Insufficient documentation

## 2024-07-17 ENCOUNTER — Other Ambulatory Visit: Payer: Self-pay | Admitting: Cardiology

## 2024-07-21 ENCOUNTER — Telehealth: Payer: Self-pay | Admitting: Cardiology

## 2024-07-21 MED ORDER — METOPROLOL SUCCINATE ER 25 MG PO TB24: 25.0000 mg | ORAL_TABLET | Freq: Every day | ORAL | 0 refills | Status: DC

## 2024-07-21 NOTE — Telephone Encounter (Signed)
 Pt's medication was sent to pt's pharmacy as requested. Confirmation received.

## 2024-07-21 NOTE — Telephone Encounter (Signed)
*  STAT* If patient is at the pharmacy, call can be transferred to refill team.   1. Which medications need to be refilled? (please list name of each medication and dose if known)   metoprolol  succinate (TOPROL -XL) 25 MG 24 hr tablet     2. Would you like to learn more about the convenience, safety, & potential cost savings by using the [facility]? No    3. Are you open to using the [facility] (Type [facility]. No    4. Which pharmacy/location (including street and city if local pharmacy) is medication to be sent to? [facility] [place], [place] - [address]     5. Do they need a 30 day or 90 day supply? 90 day   Pt has appt 11/10.

## 2024-08-10 NOTE — Progress Notes (Unsigned)
  Cardiology Office Note:   Date:  08/11/2024  ID:  [name], [name] 1934-01-24, MRN [id number] PCP: Katina Pfeiffer, PA-C  [facility] Providers Cardiologist:  Lynwood Schilling, MD {  History of Present Illness:   Melissa Lane is a 89 y.o. female who presents for follow up of coronary calcium .  Since I last saw her she has done OK from a cardiac standpoint.  She did fall and injured her arm.  This has limited her.  She walks with a cane so she usually does not fall.  She denies chest pressure, neck or arm discomfort.  She has had no palpitations, presyncope or syncope.  She has had no weight gain or edema.  She does state that her blood pressure has been elevated since her fall.  She does not recorded at home but recent recordings have all been elevated.  ROS: As stated in the HPI and negative for all other systems.  Studies Reviewed:    EKG:   EKG Interpretation Date/Time:  Thursday August 11 2024 08:39:38 EST Ventricular Rate:  54 PR Interval:  178 QRS Duration:  82 QT Interval:  438 QTC Calculation: 415 R Axis:   -11  Text Interpretation: Sinus bradycardia Minimal voltage criteria for LVH, may be normal variant ( R in aVL ) Possible Inferior infarct , age undetermined Possible Anterior infarct (cited on or before 11-Aug-2024) No significant change since last tracing Confirmed by Schilling Lynwood ([id number]) on 08/11/2024 8:47:39 AM    Risk Assessment/Calculations:       Physical Exam:   VS:  BP (!) 180/90 (BP Location: Left Arm, Patient Position: Sitting, Cuff Size: Normal)   Pulse (!) 54   Ht 5' 3 (1.6 m)   Wt 202 lb (91.6 kg)   BMI 35.78 kg/m    Wt Readings from Last 3 Encounters:  08/11/24 202 lb (91.6 kg)  07/08/24 202 lb (91.6 kg)  03/10/24 200 lb (90.7 kg)     GEN: Well nourished, well developed in no acute distress NECK: No JVD; No carotid bruits CARDIAC: RRR, no murmurs, rubs, gallops RESPIRATORY:  Clear to auscultation without rales, wheezing or  rhonchi  ABDOMEN: Soft, non-tender, non-distended EXTREMITIES:  No edema; No deformity   ASSESSMENT AND PLAN:   ELEVATED CORONARY CALCIUM :   She is having no chest discomfort.  No further testing is indicated.   PALPITATIONS:   These happen sporadically.  No change in therapy.    RISK REDUCTION: Her LDL was 83 last year.  HDL is 48.  I will defer to having this followed by her primary care physician but I think this is a reasonable target.   HTN: Her blood pressure is not at target.  I am gena add amlodipine 5 mg daily and I have asked her to get a blood pressure cuff and check it 3-4 times a week and send us  those results.     Follow up with me in 1 year.  She will come back and see an APP in about 3 months to further follow-up her blood pressure.  Signed, Lynwood Schilling, MD

## 2024-08-11 ENCOUNTER — Ambulatory Visit: Attending: Cardiology | Admitting: Cardiology

## 2024-08-11 ENCOUNTER — Other Ambulatory Visit (HOSPITAL_COMMUNITY): Payer: Self-pay

## 2024-08-11 ENCOUNTER — Encounter: Payer: Self-pay | Admitting: Cardiology

## 2024-08-11 VITALS — BP 180/90 | HR 54 | Ht 63.0 in | Wt 202.0 lb

## 2024-08-11 DIAGNOSIS — R931 Abnormal findings on diagnostic imaging of heart and coronary circulation: Secondary | ICD-10-CM | POA: Insufficient documentation

## 2024-08-11 DIAGNOSIS — R002 Palpitations: Secondary | ICD-10-CM | POA: Diagnosis not present

## 2024-08-11 MED ORDER — AMLODIPINE BESYLATE 5 MG PO TABS: 5.0000 mg | ORAL_TABLET | Freq: Every day | ORAL | 3 refills | Status: DC

## 2024-08-11 MED ORDER — AMLODIPINE BESYLATE 5 MG PO TABS
5.0000 mg | ORAL_TABLET | Freq: Every day | ORAL | 3 refills | Status: DC
  Filled 2024-08-11: qty 90, 90d supply, fill #0

## 2024-08-11 NOTE — Addendum Note (Signed)
 Addended by: CHAUVIGNE, Waver Dibiasio on: 08/11/2024 09:14 AM   Modules accepted: Orders

## 2024-08-11 NOTE — Patient Instructions (Signed)
 Medication Instructions:  Your physician has recommended you make the following change in your medication:  1) START taking amlodipine 5 mg once daily   *If you need a refill on your cardiac medications before your next appointment, please call your pharmacy*  Follow-Up: At [facility], you and your health needs are our priority.  As part of our continuing mission to provide you with exceptional heart care, our providers are all part of one team.  This team includes your primary Cardiologist (physician) and Advanced Practice Providers or APPs (Physician Assistants and Nurse Practitioners) who all work together to provide you with the care you need, when you need it.  Your next appointment:   2 months  Provider:   One of our Advanced Practice Providers (APPs): Morse Clause, PA-C  Lamarr Satterfield, NP Miriam Shams, NP  Olivia Pavy, PA-C Josefa Beauvais, NP  Leontine Salen, PA-C Orren Fabry, PA-C  [place], PA-C Ernest Dick, NP  Damien Braver, NP Jon Hails, PA-C  Waddell Donath, PA-C    Dayna Dunn, PA-C  Scott Weaver, PA-C Lum Louis, NP Katlyn West, NP Callie Goodrich, PA-C  Xika Zhao, NP Sheng Haley, PA-C    Kathleen [place], PA-C

## 2024-08-12 DIAGNOSIS — I10 Essential (primary) hypertension: Secondary | ICD-10-CM | POA: Diagnosis not present

## 2024-08-12 DIAGNOSIS — E785 Hyperlipidemia, unspecified: Secondary | ICD-10-CM | POA: Diagnosis not present

## 2024-08-12 DIAGNOSIS — R7301 Impaired fasting glucose: Secondary | ICD-10-CM | POA: Diagnosis not present

## 2024-08-15 ENCOUNTER — Telehealth: Payer: Self-pay | Admitting: Pharmacist

## 2024-08-15 DIAGNOSIS — L218 Other seborrheic dermatitis: Secondary | ICD-10-CM | POA: Diagnosis not present

## 2024-08-15 DIAGNOSIS — L821 Other seborrheic keratosis: Secondary | ICD-10-CM | POA: Diagnosis not present

## 2024-08-15 MED ORDER — METOPROLOL SUCCINATE ER 25 MG PO TB24: 25.0000 mg | ORAL_TABLET | Freq: Every day | ORAL | 3 refills | Status: AC

## 2024-08-15 MED ORDER — AMLODIPINE BESYLATE 5 MG PO TABS: 5.0000 mg | ORAL_TABLET | Freq: Every day | ORAL | 3 refills | Status: DC

## 2024-08-15 NOTE — Telephone Encounter (Signed)
 Rx request for amlodipine  and metoprolol  to [facility]

## 2024-08-16 ENCOUNTER — Other Ambulatory Visit (HOSPITAL_COMMUNITY): Payer: Self-pay

## 2024-08-16 ENCOUNTER — Telehealth: Payer: Self-pay | Admitting: Pharmacist

## 2024-08-17 ENCOUNTER — Other Ambulatory Visit (HOSPITAL_COMMUNITY): Payer: Self-pay

## 2024-09-05 DIAGNOSIS — H524 Presbyopia: Secondary | ICD-10-CM | POA: Diagnosis not present

## 2024-09-05 DIAGNOSIS — H5203 Hypermetropia, bilateral: Secondary | ICD-10-CM | POA: Diagnosis not present

## 2024-09-05 DIAGNOSIS — Z961 Presence of intraocular lens: Secondary | ICD-10-CM | POA: Diagnosis not present

## 2024-09-05 DIAGNOSIS — H47021 Hemorrhage in optic nerve sheath, right eye: Secondary | ICD-10-CM | POA: Diagnosis not present

## 2024-09-13 NOTE — Telephone Encounter (Signed)
 Pharmacy documentation

## 2024-09-20 ENCOUNTER — Ambulatory Visit: Admitting: Obstetrics and Gynecology

## 2024-09-20 ENCOUNTER — Encounter: Payer: Self-pay | Admitting: Obstetrics and Gynecology

## 2024-09-20 VITALS — BP 109/61 | HR 60

## 2024-09-20 DIAGNOSIS — Z87440 Personal history of urinary (tract) infections: Secondary | ICD-10-CM

## 2024-09-20 DIAGNOSIS — N3281 Overactive bladder: Secondary | ICD-10-CM

## 2024-09-20 DIAGNOSIS — N390 Urinary tract infection, site not specified: Secondary | ICD-10-CM

## 2024-09-20 DIAGNOSIS — R159 Full incontinence of feces: Secondary | ICD-10-CM | POA: Diagnosis not present

## 2024-09-20 NOTE — Progress Notes (Signed)
 [facility] Return Visit  SUBJECTIVE  History of Present Illness: Melissa Lane is a 89 y.o. female seen in follow-up for OAB and rUTI. Plan at last visit was continue Gemtesa  75mg  daily.   Patient does not remember taking the Gemtesa  75mg  daily. She reports she is having more issues with her bowels and she has taken 1 dose of Fosfomycin  in the last 3 months for UTI symptoms.    Patient reports her urgency and frequency is not as bothersome since she mostly stays at home.    Past Medical History: Patient  has a past medical history of Arthritis, Coronary artery calcification seen on CAT scan (2016), Hyperlipidemia, Hypertension, Peri-rectal abscess (2013), and SVT (supraventricular tachycardia) (05/2018).   Past Surgical History: She  has a past surgical history that includes Abdominal hysterectomy (1978); Cataract extraction; Breast biopsy (Left, 07/26/1999); and Total shoulder arthroplasty (Right, 08/26/2018).   Medications: She has a current medication list which includes the following prescription(s): amlodipine , atorvastatin , losartan -hydrochlorothiazide , meloxicam, and metoprolol  succinate.   Allergies: Patient is allergic to ciprofloxacin, macrodantin [nitrofurantoin macrocrystal], metronidazole, naproxen, vitamin d (calciferol), and bactrim  [sulfamethoxazole -trimethoprim ].   Social History: Patient  reports that she has never smoked. She has never used smokeless tobacco. She reports current alcohol use of about 1.0 - 2.0 standard drink of alcohol per week. She reports that she does not use drugs.     OBJECTIVE     Physical Exam: Vitals:   09/20/24 1313  BP: 109/61  Pulse: 60   Gen: No apparent distress, A&O x 3.  Detailed Urogynecologic Evaluation:  Deferred.    ASSESSMENT AND PLAN    [name] is a 89 y.o. with:  1. Overactive bladder   2. Recurrent UTI   3. Incontinence of feces, unspecified fecal incontinence type    Patient can re-start  Gemtesa  75mg  daily for urgency and frequency.  Patient has self start Fosfomycin , but if she has symptoms that are not improving she knows to call and come in for a urine sample.  For patient's bowel symptoms we discussed using bulking agents such as benefiber or metamucil to try and bulk the stool and give it more control.   Patient to follow up in 6 months or sooner if needed.    Saralyn Willison G Bryelle Spiewak, NP

## 2024-09-20 NOTE — Patient Instructions (Addendum)
 If you are having looser stools you can add in benefiber or metamucil just to help bulk the stool up and give you more control.   Please call if you have UTI symptoms. Please call if you have any issues.   Please re-start the Gemtesa  75mg  daily for the urgency and frequency.

## 2024-10-13 NOTE — Progress Notes (Unsigned)
 " Cardiology Office Note   Date: 10/14/2024  ID:  Melissa Lane Aug 31, 1934 [id number] PCP: Melissa Pfeiffer, PA-C  [facility] Providers Cardiologist: Melissa Schilling, MD     Chief Complaint: Melissa Lane is a 89 y.o.female with PMH of hypertension, palpitations, SVT, aortic atherosclerosis on coronary CT in 2017, hyperlipidemia who presents to the clinic for hypertension follow-up.    Melissa Lane was referred to Melissa Lane in 2017 for evaluation of elevated calcium  score. She was started on atorvastatin . She was referred for stress test. This was low risk for ischemia. Carotid bruit was noted on exam, carotid Dopplers showed mild bilateral ICA stenosis.   She had ETT 03/2017 and there was no evidence of ischemia. She was seen for palpitations in 2019, monitor showed predominantly sinus rhythm with average HR 70 bpm. She did have 34 episodes of SVT with the longest lasting 16 beats. She had rare PACs and PVCs. There were no triggered events. She was started on low-dose metoprolol  due to episodes of bradycardia.   She has done well and has been followed annually until 07/2024 when she was noted to have BP 180/90 in the office. She noted that her BP had been elevated since her recent fall. She was started on amlodipine  5 mg daily and was advised to monitor her BP at home 3-4 times per week.     History of Present Illness: Today she is doing well. She has been diligent to monitor her blood pressure. It has consistently fluctuated 120s-160s/60s-80s no matter the time of day. She takes her medications in the morning. She tells me that she her blood pressure acutely increased after her fall this past summer. She has taken meloxicam for arthritis for the past eight years, but someone mentioned to her that she should only take it as needed and this made her concerned to take it daily despite needing to for her pain. She has mild chronic LE edema. She no longer experiences palpitations. Her son and  daughter recently purchased an [employer] for her to wear in case of a recurrent fall. She lives by herself.  ROS: Denies chest pain, shortness of breath, lightheadedness, dizziness, syncope.   Studies Reviewed: The following studies were reviewed today: Cardiac Studies & Procedures   ______________________________________________________________________________________________   STRESS TESTS  EXERCISE TOLERANCE TEST (ETT) 04/15/2017  Interpretation Summary  Blood pressure demonstrated a hypertensive response to exercise.  There was no ST segment deviation noted during stress.  Negative exercise stress test for ischemia. The patient achieved 96% of MPHR.  Moderately impaired exercise tolerance. She achieved 5 mets.  This is a low risk study.      MONITORS  LONG TERM MONITOR (3-14 DAYS) 06/04/2018  Narrative NSR, SVT longest 15 beats Rare SVEs.       ______________________________________________________________________________________________                   Physical Exam: VS: BP (!) 162/70   Pulse 71   Ht 5' 3 (1.6 m)   Wt 202 lb (91.6 kg)   SpO2 97%   BMI 35.78 kg/m   GEN: Well nourished, in NAD HEENT: Normal, hard of hearing NECK: No JVD CARDIAC: RRR, no murmurs, rubs, gallops RESPIRATORY: Clear to auscultation bilaterally ABDOMEN: Soft, non-tender, non-distended MUSCULOSKELETAL: No edema, utilizes a cane for ambulation SKIN: Warm and dry NEUROLOGIC:  Alert and oriented x 3 PSYCHIATRIC:  Normal affect   Assessment & Plan: 1. Hypertension: BP today 162/70, consistent with home readings.  She does have fluctuations with her blood pressure during all times of the day, 120s-160s/60s-80s. She denies lightheadedness, dizziness, and syncope.  - Increase amlodipine  to 10 mg daily - Discussed continuing to monitor her BP at home for the next couple of weeks to ensure that she does not have low readings - Continue losartan -hydrochlorothiazide  100/12.5  daily  2. SVT/Palpitations: 34 episodes of SVT on monitor in 2019. She was started on beta blocker and her symptoms improved. She tells me that she never feels palpitations anymore. - Continue Toprol -XL 25 mg daily  3. Aortic atherosclerosis/elevated calcium  score: Calcium  score was done by her PCP and she was referred to cardiology. She has had several stress tests and these have all been low risk for ischemia. She denies anginal symptoms today. - Continue atorvastatin  40 mg daily  4. Hyperlipidemia: Lipids followed by PCP. Most recent LDL was 83, HDL 48.  - Continue atorvastatin  40 mg daily  Dispo: Follow-up with Melissa Lane in 3-4 months.  Signed, Melissa GORMAN Cleaves, NP 10/14/2024 2:12 PM [facility] "

## 2024-10-14 ENCOUNTER — Encounter: Payer: Self-pay | Admitting: General Practice

## 2024-10-14 ENCOUNTER — Ambulatory Visit: Attending: General Practice

## 2024-10-14 VITALS — BP 162/70 | HR 71 | Ht 63.0 in | Wt 202.0 lb

## 2024-10-14 DIAGNOSIS — E782 Mixed hyperlipidemia: Secondary | ICD-10-CM | POA: Diagnosis present

## 2024-10-14 DIAGNOSIS — I10 Essential (primary) hypertension: Secondary | ICD-10-CM | POA: Diagnosis not present

## 2024-10-14 DIAGNOSIS — I471 Supraventricular tachycardia, unspecified: Secondary | ICD-10-CM | POA: Diagnosis not present

## 2024-10-14 DIAGNOSIS — I700 Atherosclerosis of aorta: Secondary | ICD-10-CM | POA: Diagnosis not present

## 2024-10-14 MED ORDER — AMLODIPINE BESYLATE 10 MG PO TABS: 10.0000 mg | ORAL_TABLET | Freq: Every day | ORAL | 3 refills | Status: AC

## 2024-10-14 NOTE — Patient Instructions (Signed)
 Medication Instructions:  Amlodipine  has been increased to 10mg  daily. *If you need a refill on your cardiac medications before your next appointment, please call your pharmacy*  Lab Work: None  If you have labs (blood work) drawn today and your tests are completely normal, you will receive your results only by: MyChart Message (if you have MyChart) OR A paper copy in the mail If you have any lab test that is abnormal or we need to change your treatment, we will call you to review the results.  Testing/Procedures: None   Follow-Up: At [facility], you and your health needs are our priority.  As part of our continuing mission to provide you with exceptional heart care, our providers are all part of one team.  This team includes your primary Cardiologist (physician) and Advanced Practice Providers or APPs (Physician Assistants and Nurse Practitioners) who all work together to provide you with the care you need, when you need it.  Your next appointment:   3 - 4  month(s)  Provider:   Lynwood Schilling, MD     Other Instructions None

## 2025-01-12 ENCOUNTER — Ambulatory Visit: Admitting: Cardiology
